# Patient Record
Sex: Male | Born: 1980 | Race: Black or African American | Hispanic: No | Marital: Married | Smoking: Former smoker
Health system: Southern US, Community
[De-identification: ages and names within clinical notes are randomized; demographics above are authoritative.]

## PROBLEM LIST (undated history)

## (undated) ENCOUNTER — Emergency Department (HOSPITAL_COMMUNITY): Payer: Self-pay | Source: Home / Self Care

## (undated) DIAGNOSIS — D573 Sickle-cell trait: Secondary | ICD-10-CM

## (undated) HISTORY — PX: HERNIA REPAIR: SHX51

## (undated) HISTORY — PX: TOOTH EXTRACTION: SUR596

---
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
## 2001-09-20 ENCOUNTER — Emergency Department (HOSPITAL_COMMUNITY): Admission: EM | Admit: 2001-09-20 | Discharge: 2001-09-20 | Payer: Self-pay | Admitting: Emergency Medicine

## 2003-03-05 ENCOUNTER — Emergency Department (HOSPITAL_COMMUNITY): Admission: EM | Admit: 2003-03-05 | Discharge: 2003-03-05 | Payer: Self-pay | Admitting: Emergency Medicine

## 2003-03-07 ENCOUNTER — Emergency Department (HOSPITAL_COMMUNITY): Admission: EM | Admit: 2003-03-07 | Discharge: 2003-03-07 | Payer: Self-pay | Admitting: Emergency Medicine

## 2003-03-09 ENCOUNTER — Emergency Department (HOSPITAL_COMMUNITY): Admission: EM | Admit: 2003-03-09 | Discharge: 2003-03-09 | Payer: Self-pay | Admitting: Emergency Medicine

## 2003-07-19 ENCOUNTER — Emergency Department (HOSPITAL_COMMUNITY): Admission: AD | Admit: 2003-07-19 | Discharge: 2003-07-19 | Payer: Self-pay | Admitting: Family Medicine

## 2003-07-20 ENCOUNTER — Emergency Department (HOSPITAL_COMMUNITY): Admission: AD | Admit: 2003-07-20 | Discharge: 2003-07-20 | Payer: Self-pay | Admitting: Family Medicine

## 2003-08-28 ENCOUNTER — Emergency Department (HOSPITAL_COMMUNITY): Admission: AD | Admit: 2003-08-28 | Discharge: 2003-08-28 | Payer: Self-pay | Admitting: Family Medicine

## 2003-12-30 ENCOUNTER — Emergency Department (HOSPITAL_COMMUNITY): Admission: EM | Admit: 2003-12-30 | Discharge: 2003-12-30 | Payer: Self-pay | Admitting: Emergency Medicine

## 2004-10-08 ENCOUNTER — Emergency Department (HOSPITAL_COMMUNITY): Admission: EM | Admit: 2004-10-08 | Discharge: 2004-10-08 | Payer: Self-pay | Admitting: Family Medicine

## 2004-12-24 ENCOUNTER — Emergency Department (HOSPITAL_COMMUNITY): Admission: EM | Admit: 2004-12-24 | Discharge: 2004-12-24 | Payer: Self-pay | Admitting: Family Medicine

## 2005-01-14 ENCOUNTER — Emergency Department (HOSPITAL_COMMUNITY): Admission: EM | Admit: 2005-01-14 | Discharge: 2005-01-14 | Payer: Self-pay | Admitting: Emergency Medicine

## 2005-02-05 ENCOUNTER — Emergency Department (HOSPITAL_COMMUNITY): Admission: EM | Admit: 2005-02-05 | Discharge: 2005-02-05 | Payer: Self-pay | Admitting: Family Medicine

## 2005-07-25 ENCOUNTER — Emergency Department (HOSPITAL_COMMUNITY): Admission: EM | Admit: 2005-07-25 | Discharge: 2005-07-26 | Payer: Self-pay | Admitting: *Deleted

## 2006-01-27 ENCOUNTER — Emergency Department (HOSPITAL_COMMUNITY): Admission: EM | Admit: 2006-01-27 | Discharge: 2006-01-27 | Payer: Self-pay | Admitting: Family Medicine

## 2006-09-09 ENCOUNTER — Emergency Department (HOSPITAL_COMMUNITY): Admission: EM | Admit: 2006-09-09 | Discharge: 2006-09-09 | Payer: Self-pay | Admitting: Emergency Medicine

## 2007-05-27 ENCOUNTER — Emergency Department: Payer: Self-pay | Admitting: Internal Medicine

## 2007-12-17 ENCOUNTER — Emergency Department: Payer: Self-pay | Admitting: Unknown Physician Specialty

## 2008-04-07 ENCOUNTER — Emergency Department (HOSPITAL_COMMUNITY): Admission: EM | Admit: 2008-04-07 | Discharge: 2008-04-07 | Payer: Self-pay | Admitting: Family Medicine

## 2008-07-20 ENCOUNTER — Ambulatory Visit (HOSPITAL_COMMUNITY): Admission: RE | Admit: 2008-07-20 | Discharge: 2008-07-20 | Payer: Self-pay | Admitting: Family Medicine

## 2008-12-04 ENCOUNTER — Ambulatory Visit (HOSPITAL_COMMUNITY): Admission: RE | Admit: 2008-12-04 | Discharge: 2008-12-04 | Payer: Self-pay | Admitting: Family Medicine

## 2009-02-07 ENCOUNTER — Ambulatory Visit (HOSPITAL_COMMUNITY): Admission: RE | Admit: 2009-02-07 | Discharge: 2009-02-07 | Payer: Self-pay | Admitting: Family Medicine

## 2009-03-01 ENCOUNTER — Encounter: Admission: RE | Admit: 2009-03-01 | Discharge: 2009-03-01 | Payer: Self-pay | Admitting: Family Medicine

## 2009-05-23 ENCOUNTER — Encounter: Admission: RE | Admit: 2009-05-23 | Discharge: 2009-05-23 | Payer: Self-pay | Admitting: Family Medicine

## 2009-12-19 ENCOUNTER — Emergency Department (HOSPITAL_COMMUNITY): Admission: EM | Admit: 2009-12-19 | Discharge: 2009-12-19 | Payer: Self-pay | Admitting: Emergency Medicine

## 2010-01-02 ENCOUNTER — Ambulatory Visit: Payer: Self-pay | Admitting: Internal Medicine

## 2010-01-09 ENCOUNTER — Emergency Department: Payer: Self-pay | Admitting: Emergency Medicine

## 2010-01-10 ENCOUNTER — Emergency Department: Payer: Self-pay | Admitting: Emergency Medicine

## 2010-01-15 ENCOUNTER — Ambulatory Visit: Payer: Self-pay | Admitting: Internal Medicine

## 2010-02-01 ENCOUNTER — Ambulatory Visit: Payer: Self-pay | Admitting: Internal Medicine

## 2010-04-04 ENCOUNTER — Emergency Department (HOSPITAL_COMMUNITY): Admission: EM | Admit: 2010-04-04 | Discharge: 2010-04-04 | Payer: Self-pay | Admitting: Family Medicine

## 2010-11-17 ENCOUNTER — Emergency Department (HOSPITAL_COMMUNITY): Payer: Medicaid Other

## 2010-11-17 ENCOUNTER — Emergency Department (HOSPITAL_COMMUNITY)
Admission: EM | Admit: 2010-11-17 | Discharge: 2010-11-18 | Disposition: A | Discharge status: Home / Self Care | Payer: Medicaid Other | Attending: Emergency Medicine | Admitting: Emergency Medicine

## 2010-11-17 DIAGNOSIS — K5289 Other specified noninfective gastroenteritis and colitis: Secondary | ICD-10-CM | POA: Insufficient documentation

## 2010-11-17 DIAGNOSIS — R112 Nausea with vomiting, unspecified: Secondary | ICD-10-CM | POA: Insufficient documentation

## 2010-11-17 DIAGNOSIS — R197 Diarrhea, unspecified: Secondary | ICD-10-CM | POA: Insufficient documentation

## 2010-11-17 LAB — DIFFERENTIAL
Basophils Absolute: 0 10*3/uL (ref 0.0–0.1)
Eosinophils Absolute: 0 10*3/uL (ref 0.0–0.7)
Eosinophils Relative: 0 % (ref 0–5)
Monocytes Absolute: 0.5 10*3/uL (ref 0.1–1.0)
Neutro Abs: 6.3 10*3/uL (ref 1.7–7.7)
Neutrophils Relative %: 87 % — ABNORMAL HIGH (ref 43–77)

## 2010-11-17 LAB — COMPREHENSIVE METABOLIC PANEL
BUN: 12 mg/dL (ref 6–23)
CO2: 22 mEq/L (ref 19–32)
Calcium: 9.1 mg/dL (ref 8.4–10.5)
Chloride: 101 mEq/L (ref 96–112)
Creatinine, Ser: 1.12 mg/dL (ref 0.4–1.5)
GFR calc non Af Amer: >60 mL/min (ref >60)
Glucose, Bld: 107 mg/dL — ABNORMAL HIGH (ref 70–99)
Potassium: 3.9 mEq/L (ref 3.5–5.1)
Sodium: 131 mEq/L — ABNORMAL LOW (ref 135–145)

## 2010-11-17 LAB — URINALYSIS, ROUTINE W REFLEX MICROSCOPIC
Glucose, UA: NEGATIVE mg/dL
Hgb urine dipstick: NEGATIVE
Urobilinogen, UA: 0.2 mg/dL (ref 0.0–1.0)

## 2010-11-17 LAB — CBC
HCT: 42.1 % (ref 39.0–52.0)
RBC: 5.27 MIL/uL (ref 4.22–5.81)
WBC: 7.3 10*3/uL (ref 4.0–10.5)

## 2010-11-18 MED ORDER — IOHEXOL 300 MG/ML  SOLN
100.0000 mL | Freq: Once | INTRAMUSCULAR | Status: AC | PRN
  Administered 2010-11-18: 100 mL via INTRAVENOUS

## 2011-05-19 ENCOUNTER — Inpatient Hospital Stay (INDEPENDENT_AMBULATORY_CARE_PROVIDER_SITE_OTHER)
Admission: RE | Admit: 2011-05-19 | Discharge: 2011-05-19 | Disposition: A | Discharge status: Home / Self Care | Payer: Medicaid Other | Source: Ambulatory Visit | Attending: Family Medicine | Admitting: Family Medicine

## 2011-05-19 DIAGNOSIS — K047 Periapical abscess without sinus: Secondary | ICD-10-CM

## 2011-07-16 ENCOUNTER — Other Ambulatory Visit: Payer: Self-pay

## 2011-07-16 ENCOUNTER — Encounter: Payer: Self-pay | Admitting: Emergency Medicine

## 2011-07-16 ENCOUNTER — Emergency Department (HOSPITAL_COMMUNITY): Payer: Medicaid Other

## 2011-07-16 ENCOUNTER — Emergency Department (HOSPITAL_COMMUNITY)
Admission: EM | Admit: 2011-07-16 | Discharge: 2011-07-17 | Disposition: A | Discharge status: Home / Self Care | Payer: Medicaid Other | Attending: Emergency Medicine | Admitting: Emergency Medicine

## 2011-07-16 DIAGNOSIS — R5383 Other fatigue: Secondary | ICD-10-CM | POA: Insufficient documentation

## 2011-07-16 DIAGNOSIS — R05 Cough: Secondary | ICD-10-CM | POA: Insufficient documentation

## 2011-07-16 DIAGNOSIS — R197 Diarrhea, unspecified: Secondary | ICD-10-CM | POA: Insufficient documentation

## 2011-07-16 DIAGNOSIS — R070 Pain in throat: Secondary | ICD-10-CM | POA: Insufficient documentation

## 2011-07-16 DIAGNOSIS — R509 Fever, unspecified: Secondary | ICD-10-CM | POA: Insufficient documentation

## 2011-07-16 DIAGNOSIS — J111 Influenza due to unidentified influenza virus with other respiratory manifestations: Secondary | ICD-10-CM | POA: Insufficient documentation

## 2011-07-16 DIAGNOSIS — R0602 Shortness of breath: Secondary | ICD-10-CM | POA: Insufficient documentation

## 2011-07-16 DIAGNOSIS — J3489 Other specified disorders of nose and nasal sinuses: Secondary | ICD-10-CM | POA: Insufficient documentation

## 2011-07-16 DIAGNOSIS — R059 Cough, unspecified: Secondary | ICD-10-CM | POA: Insufficient documentation

## 2011-07-16 DIAGNOSIS — R5381 Other malaise: Secondary | ICD-10-CM | POA: Insufficient documentation

## 2011-07-16 DIAGNOSIS — R079 Chest pain, unspecified: Secondary | ICD-10-CM | POA: Insufficient documentation

## 2011-07-16 DIAGNOSIS — R110 Nausea: Secondary | ICD-10-CM | POA: Insufficient documentation

## 2011-07-16 DIAGNOSIS — IMO0001 Reserved for inherently not codable concepts without codable children: Secondary | ICD-10-CM | POA: Insufficient documentation

## 2011-07-16 DIAGNOSIS — R000 Tachycardia, unspecified: Secondary | ICD-10-CM | POA: Insufficient documentation

## 2011-07-16 HISTORY — DX: Sickle-cell trait: D57.3

## 2011-07-16 LAB — COMPREHENSIVE METABOLIC PANEL
ALT: 41 U/L (ref 0–53)
BUN: 9 mg/dL (ref 6–23)
CO2: 26 mEq/L (ref 19–32)
Calcium: 9.4 mg/dL (ref 8.4–10.5)
Chloride: 100 mEq/L (ref 96–112)
GFR calc Af Amer: >90 mL/min (ref >90)
Glucose, Bld: 137 mg/dL — ABNORMAL HIGH (ref 70–99)
Total Bilirubin: 0.3 mg/dL (ref 0.3–1.2)
Total Protein: 7.2 g/dL (ref 6.0–8.3)

## 2011-07-16 LAB — CBC
HCT: 39.8 % (ref 39.0–52.0)
MCH: 27.8 pg (ref 26.0–34.0)
MCHC: 34.7 g/dL (ref 30.0–36.0)
MCV: 80.1 fL (ref 78.0–100.0)
Platelets: 243 10*3/uL (ref 150–400)
RBC: 4.97 MIL/uL (ref 4.22–5.81)
RDW: 13.9 % (ref 11.5–15.5)

## 2011-07-16 LAB — DIFFERENTIAL
Basophils Relative: 0 % (ref 0–1)
Monocytes Relative: 11 % (ref 3–12)
Neutrophils Relative %: 81 % — ABNORMAL HIGH (ref 43–77)

## 2011-07-16 LAB — RETICULOCYTES: Retic Ct Pct: 1.1 % (ref 0.4–3.1)

## 2011-07-16 MED ORDER — ONDANSETRON HCL 4 MG/2ML IJ SOLN
4.0000 mg | Freq: Once | INTRAMUSCULAR | Status: AC
  Administered 2011-07-16: 4 mg via INTRAVENOUS
  Filled 2011-07-16: qty 2

## 2011-07-16 MED ORDER — SODIUM CHLORIDE 0.9 % IV BOLUS (SEPSIS)
1000.0000 mL | Freq: Once | INTRAVENOUS | Status: AC
  Administered 2011-07-16: 1000 mL via INTRAVENOUS

## 2011-07-16 MED ORDER — KETOROLAC TROMETHAMINE 30 MG/ML IJ SOLN
30.0000 mg | Freq: Once | INTRAMUSCULAR | Status: AC
  Administered 2011-07-16: 30 mg via INTRAVENOUS
  Filled 2011-07-16: qty 1

## 2011-07-16 NOTE — ED Notes (Signed)
Dr Miller at bedside. 

## 2011-07-16 NOTE — ED Notes (Signed)
Patient complaining of body aches and weakness since yesterday. Also complaining of diarrhea. Denies nausea/vomiting.

## 2011-07-16 NOTE — ED Provider Notes (Addendum)
History     CSN: [id number] Arrival date & time: 07/16/2011 10:55 PM   First MD Initiated Contact with Patient 07/16/11 2302      Chief Complaint  Patient presents with  . Generalized Body Aches  . Weakness    (Consider location/radiation/quality/duration/timing/severity/associated sxs/prior treatment) HPI Comments: Pt presents with CC of weakness. Sx started yeserday Gradual in onset Constant Nothing makes better or worse Associated with f/c/n/diarrhea/cough and sore throat.  He has no sick contacts but does have sickle cell trait - has never required transfusion and according to my review of the EMR, he has no admissions to this hospital for SS complications.  Patient is a 30 y.o. male presenting with weakness. The history is provided by the patient and medical records.  Weakness The primary symptoms include nausea. Primary symptoms do not include vomiting.  Additional symptoms include weakness. Additional symptoms do not include neck stiffness.    Past Medical History  Diagnosis Date  . Sickle cell trait     History reviewed. No pertinent past surgical history.  History reviewed. No pertinent family history.  History  Substance Use Topics  . Smoking status: Never Smoker   . Smokeless tobacco: Not on file  . Alcohol Use: No      Review of Systems  Constitutional: Positive for chills and fatigue.  HENT: Positive for congestion and sore throat. Negative for ear pain, neck pain, neck stiffness and ear discharge.   Eyes: Negative for pain, redness and visual disturbance.  Respiratory: Positive for cough and shortness of breath.   Cardiovascular: Positive for chest pain. Negative for palpitations and leg swelling.  Gastrointestinal: Positive for nausea and diarrhea ( loose stools). Negative for vomiting and abdominal pain.  Genitourinary: Negative for dysuria.  Musculoskeletal: Positive for myalgias. Negative for joint swelling.  Skin: Negative for rash.    Neurological: Positive for weakness.  Hematological: Negative for adenopathy.    Allergies  Amoxicillin  Home Medications   Current Outpatient Rx  Name Route Sig Dispense Refill  . NAPROXEN 500 MG PO TABS Oral Take 1 tablet (500 mg total) by mouth 2 (two) times daily with a meal. 30 tablet 0    BP 118/63  Pulse 111  Temp(Src) 100.1 F (37.8 C) (Oral)  Resp 16  Ht 5\' 11"  (1.803 m)  Wt 191 lb (86.637 kg)  BMI 26.64 kg/m2  SpO2 99%  Physical Exam  Nursing note and vitals reviewed. Constitutional: He appears well-developed and well-nourished. No distress.  HENT:  Head: Normocephalic and atraumatic.  Mouth/Throat: Oropharynx is clear and moist. No oropharyngeal exudate.       OP is mildly erythematous without asymetry, exudate or hypertrophy  Eyes: Conjunctivae and EOM are normal. Pupils are equal, round, and reactive to light. Right eye exhibits no discharge. Left eye exhibits no discharge. No scleral icterus.  Neck: Normal range of motion. Neck supple. No JVD present. No thyromegaly present.  Cardiovascular: Regular rhythm, normal heart sounds and intact distal pulses.  Exam reveals no gallop and no friction rub.   No murmur heard.      Tachycardia   Pulmonary/Chest: Effort normal and breath sounds normal. No respiratory distress. He has no wheezes. He has no rales.  Abdominal: Soft. Bowel sounds are normal. He exhibits no distension and no mass. There is no tenderness.  Musculoskeletal: Normal range of motion. He exhibits no edema and no tenderness.  Lymphadenopathy:    He has no cervical adenopathy.  Neurological: He is alert. Coordination normal.  Skin: Skin is warm and dry. No rash noted. No erythema.  Psychiatric: He has a normal mood and affect. His behavior is normal.    ED Course  Procedures (including critical care time)  Labs Reviewed  DIFFERENTIAL - Abnormal; Notable for the following:    Neutrophils Relative 81 (*)    Lymphocytes Relative 8 (*)     Lymphs Abs 0.6 (*)    All other components within normal limits  COMPREHENSIVE METABOLIC PANEL - Abnormal; Notable for the following:    Potassium 3.4 (*)    Glucose, Bld 137 (*)    All other components within normal limits  CBC  RETICULOCYTES  URINALYSIS, ROUTINE W REFLEX MICROSCOPIC   Dg Chest 2 View  07/17/2011  *RADIOLOGY REPORT*  Clinical Data: Cough and congestion  CHEST - 2 VIEW  Comparison: 07/20/2008.  CT scan from 01/14/2005  Findings: The lungs are clear without focal infiltrate, edema, pneumothorax or pleural effusion. The cardiopericardial silhouette is within normal limits for size. Nodular densities seen posterior to the hilar region on the lateral film corresponds to the finding seen on the CT scan from 2006.  This is stable in the interval.  IMPRESSION: Stable exam.  No acute findings.  Original Report Authenticated By: ERIC A. MANSELL, M.D.     1. Influenza       MDM  Tachycardia and fever - c/w Flu like illness - no sounds on lung exam to suggest underlying pna or acute chest and pt is sickle trait, not sickle cell dz.  Has some orthostasis as he sits up with pulse from 105 to 125.  IVF, meds, labs and CXR.  Lab work revealed no acute findings, chest x-ray negative, review of records shows the patient has sickle cell trait, not sickle cell disease. He will be treated with anti-inflammatories, fever control at home, followup as needed. These findings were discussed the patient  Home medications  Naprosyn 500 mg by mouth twice a day      Vida Roller, MD 07/17/11 0120  ED ECG REPORT   Date: 07/17/2011   Rate: 109  Rhythm: sinus tachycardia  QRS Axis: normal  Intervals: normal  ST/T Wave abnormalities: normal  Conduction Disutrbances:none  Narrative Interpretation:   Old EKG Reviewed: No old EKG for comparison   Vida Roller, MD 07/17/11 [phone]

## 2011-07-17 LAB — URINALYSIS, ROUTINE W REFLEX MICROSCOPIC
Hgb urine dipstick: NEGATIVE
Ketones, ur: NEGATIVE mg/dL
Urobilinogen, UA: 0.2 mg/dL (ref 0.0–1.0)
pH: 6 (ref 5.0–8.0)

## 2011-07-17 MED ORDER — MORPHINE SULFATE 4 MG/ML IJ SOLN
4.0000 mg | Freq: Once | INTRAMUSCULAR | Status: AC
  Administered 2011-07-17: 4 mg via INTRAVENOUS
  Filled 2011-07-17: qty 1

## 2011-07-17 MED ORDER — NAPROXEN 500 MG PO TABS: 500.0000 mg | ORAL_TABLET | Freq: Two times a day (BID) | ORAL | Status: DC

## 2011-07-17 MED ORDER — SODIUM CHLORIDE 0.9 % IV BOLUS (SEPSIS)
1000.0000 mL | Freq: Once | INTRAVENOUS | Status: AC
  Administered 2011-07-17: 1000 mL via INTRAVENOUS

## 2011-07-17 NOTE — ED Notes (Signed)
Patient transported to X-ray 

## 2011-07-17 NOTE — ED Notes (Signed)
Pt reporting improvement in pain level following morphine administration.

## 2012-02-18 ENCOUNTER — Other Ambulatory Visit: Payer: Self-pay | Admitting: Family Medicine

## 2012-02-18 DIAGNOSIS — M5126 Other intervertebral disc displacement, lumbar region: Secondary | ICD-10-CM

## 2012-02-23 ENCOUNTER — Inpatient Hospital Stay: Admission: RE | Admit: 2012-02-23 | Payer: Medicaid Other | Source: Ambulatory Visit

## 2012-03-15 ENCOUNTER — Inpatient Hospital Stay: Admission: RE | Admit: 2012-03-15 | Payer: Medicaid Other | Source: Ambulatory Visit

## 2012-03-19 ENCOUNTER — Other Ambulatory Visit: Payer: Medicaid Other

## 2012-05-19 NOTE — Patient Instructions (Addendum)
20 ABDI HUSAK  05/19/2012   Your procedure is scheduled on:  05/24/2012  Report to [facility] at  1050  AM.  Call this number if you have problems the morning of surgery: [phone]   Remember:   Do not eat food:After Midnight.  May have clear liquids:until Midnight   Take these medicines the morning of surgery with A SIP OF WATER:  none   Do not wear jewelry, make-up or nail polish.  Do not wear lotions, powders, or perfumes. You may wear deodorant.  Do not shave 48 hours prior to surgery. Men may shave face and neck.  Do not bring valuables to the hospital.  Contacts, dentures or bridgework may not be worn into surgery.  Leave suitcase in the car. After surgery it may be brought to your room.  For patients admitted to the hospital, checkout time is 11:00 AM the day of discharge.   Patients discharged the day of surgery will not be allowed to drive home.  Name and phone number of your driver: family  Special Instructions: Shower using CHG 2 nights before surgery and the night before surgery.  If you shower the day of surgery use CHG.  Use special wash - you have one bottle of CHG for all showers.  You should use approximately 1/3 of the bottle for each shower.   Please read over the following fact sheets that you were given: Pain Booklet, MRSA Information, Surgical Site Infection Prevention, Anesthesia Post-op Instructions and Care and Recovery After Surgery Hernia A hernia occurs when an internal organ pushes out through a weak spot in the abdominal wall. Hernias most commonly occur in the groin and around the navel. Hernias often can be pushed back into place (reduced). Most hernias tend to get worse over time. Some abdominal hernias can get stuck in the opening (irreducible or incarcerated hernia) and cannot be reduced. An irreducible abdominal hernia which is tightly squeezed into the opening is at risk for impaired blood supply (strangulated hernia). A strangulated hernia is a  medical emergency. Because of the risk for an irreducible or strangulated hernia, surgery may be recommended to repair a hernia. CAUSES   Heavy lifting.  Prolonged coughing.  Straining to have a bowel movement.  A cut (incision) made during an abdominal surgery. HOME CARE INSTRUCTIONS   Bed rest is not required. You may continue your normal activities.  Avoid lifting more than 10 pounds (4.5 kg) or straining.  Cough gently. If you are a smoker it is best to stop. Even the best hernia repair can break down with the continual strain of coughing. Even if you do not have your hernia repaired, a cough will continue to aggravate the problem.  Do not wear anything tight over your hernia. Do not try to keep it in with an outside bandage or truss. These can damage abdominal contents if they are trapped within the hernia sac.  Eat a normal diet.  Avoid constipation. Straining over long periods of time will increase hernia size and encourage breakdown of repairs. If you cannot do this with diet alone, stool softeners may be used. SEEK IMMEDIATE MEDICAL CARE IF:   You have a fever.  You develop increasing abdominal pain.  You feel nauseous or vomit.  Your hernia is stuck outside the abdomen, looks discolored, feels hard, or is tender.  You have any changes in your bowel habits or in the hernia that are unusual for you.  You have increased  pain or swelling around the hernia.  You cannot push the hernia back in place by applying gentle pressure while lying down. MAKE SURE YOU:   Understand these instructions.  Will watch your condition.  Will get help right away if you are not doing well or get worse. Document Released: 07/21/2005 Document Revised: 10/13/2011 Document Reviewed: 03/09/2008 [facility] Patient Information 2013 [place], [place]. PATIENT INSTRUCTIONS POST-ANESTHESIA  IMMEDIATELY FOLLOWING SURGERY:  Do not drive or operate machinery for the first twenty four hours after  surgery.  Do not make any important decisions for twenty four hours after surgery or while taking narcotic pain medications or sedatives.  If you develop intractable nausea and vomiting or a severe headache please notify your doctor immediately.  FOLLOW-UP:  Please make an appointment with your surgeon as instructed. You do not need to follow up with anesthesia unless specifically instructed to do so.  WOUND CARE INSTRUCTIONS (if applicable):  Keep a dry clean dressing on the anesthesia/puncture wound site if there is drainage.  Once the wound has quit draining you may leave it open to air.  Generally you should leave the bandage intact for twenty four hours unless there is drainage.  If the epidural site drains for more than 36-48 hours please call the anesthesia department.  QUESTIONS?:  Please feel free to call your physician or the hospital operator if you have any questions, and they will be happy to assist you.

## 2012-05-20 ENCOUNTER — Inpatient Hospital Stay (HOSPITAL_COMMUNITY): Admission: RE | Admit: 2012-05-20 | Discharge: 2012-05-20 | Payer: Medicaid Other | Source: Ambulatory Visit

## 2012-05-20 ENCOUNTER — Encounter (HOSPITAL_COMMUNITY): Payer: Self-pay | Admitting: Pharmacy Technician

## 2012-05-21 ENCOUNTER — Encounter (HOSPITAL_COMMUNITY)
Admission: RE | Admit: 2012-05-21 | Discharge: 2012-05-21 | Disposition: A | Discharge status: Home / Self Care | Payer: Medicaid Other | Source: Ambulatory Visit | Attending: General Surgery | Admitting: General Surgery

## 2012-05-21 ENCOUNTER — Encounter (HOSPITAL_COMMUNITY): Payer: Self-pay

## 2012-05-21 LAB — CBC WITH DIFFERENTIAL/PLATELET
Basophils Absolute: 0 10*3/uL (ref 0.0–0.1)
HCT: 41.1 % (ref 39.0–52.0)
Lymphocytes Relative: 39 % (ref 12–46)
Monocytes Absolute: 0.6 10*3/uL (ref 0.1–1.0)
Neutro Abs: 2.2 10*3/uL (ref 1.7–7.7)
Neutrophils Relative %: 44 % (ref 43–77)
Platelets: 310 10*3/uL (ref 150–400)
RDW: 13.8 % (ref 11.5–15.5)
WBC: 5.1 10*3/uL (ref 4.0–10.5)

## 2012-05-21 LAB — SURGICAL PCR SCREEN
MRSA, PCR: NEGATIVE
Staphylococcus aureus: NEGATIVE

## 2012-05-21 LAB — BASIC METABOLIC PANEL
Chloride: 99 mEq/L (ref 96–112)
Creatinine, Ser: 1.01 mg/dL (ref 0.50–1.35)
GFR calc Af Amer: >90 mL/min (ref >90)

## 2012-05-21 NOTE — Patient Instructions (Signed)
20 Dennis Travis  05/21/2012   Your procedure is scheduled on:  Monday, 05/24/12  Report to [facility] at 1050 AM.  Call this number if you have problems the morning of surgery: [phone]   Remember:   Do not eat food:After Midnight.  May have clear liquids:until Midnight .  Clear liquids include soda, tea, black coffee, apple or grape juice, broth.  Take these medicines the morning of surgery with A SIP OF WATER: none   Do not wear jewelry, make-up or nail polish.  Do not wear lotions, powders, or perfumes. You may wear deodorant.  Do not shave 48 hours prior to surgery. Men may shave face and neck.  Do not bring valuables to the hospital.  Contacts, dentures or bridgework may not be worn into surgery.  Leave suitcase in the car. After surgery it may be brought to your room.  For patients admitted to the hospital, checkout time is 11:00 AM the day of discharge.   Patients discharged the day of surgery will not be allowed to drive home.  Name and phone number of your driver: family  Special Instructions: Shower using CHG 2 nights before surgery and the night before surgery.  If you shower the day of surgery use CHG.  Use special wash - you have one bottle of CHG for all showers.  You should use approximately 1/3 of the bottle for each shower.   Please read over the following fact sheets that you were given: Pain Booklet, Coughing and Deep Breathing, MRSA Information, Surgical Site Infection Prevention, Anesthesia Post-op Instructions and Care and Recovery After Surgery   Hernia, Surgical Repair Care After Refer to this sheet in the next few weeks. These discharge instructions provide you with general information on caring for yourself after you leave the hospital. Your caregiver may also give you specific instructions. Your treatment has been planned according to the most current medical practices available, but unavoidable complications sometimes occur. If you have any problems or  questions after discharge, please call your caregiver. HOME CARE INSTRUCTIONS   It is normal to be sore for a couple weeks after surgery. See your caregiver if this seems to be getting worse rather than better.  Put ice on the operative site.  Put ice in a plastic bag.  Place a towel between your skin and the bag.  Leave the ice on for 15 to 20 minutes at a time, 3 to 4 times a day for the first 2 days.  Change bandages (dressings) as directed.  Keep the wound dry and clean. The wound may be washed gently with soap and water. Gently blot or dab the wound dry. Do not take baths, use swimming pools, or use hot tubs for 10 days, or as directed by your caregiver.  Only take over-the-counter or prescription medicines for pain, discomfort, or fever as directed by your caregiver.  Continue your normal diet as directed.  Do not drive until your caregiver says it is okay.  Do not lift anything more than 10 pounds or play contact sports for 3 weeks, or as directed.  Make an appointment to see your caregiver for stitches (sutures) or staple removal when instructed. SEEK MEDICAL CARE IF:   You have increased bleeding coming from the wounds.  You have blood in your stool.  You see redness, swelling, or have increasing pain in the wounds.  You have fluid (pus) coming from the wound.  You have an oral temperature above 102 F (38.9  C).  You notice a bad smell coming from the wound or dressing.  You develop lightheadedness or feel faint. SEEK IMMEDIATE MEDICAL CARE IF:   You develop a rash.  You have difficulty breathing.  You develop any reaction or side effects to medicines given. MAKE SURE YOU:   Understand these instructions.  Will watch your condition.  Will get help right away if you are not doing well or get worse. Document Released: 02/07/2005 Document Revised: 10/13/2011 Document Reviewed: 06/20/2009 [facility] Patient Information 2013 [place], [place]. Hernia,  Surgical Repair A hernia occurs when an internal organ pushes out through a weak spot in the belly (abdominal) wall muscles. Hernias commonly occur in the groin and around the navel. Hernias often can be pushed back into place (reduced). Most hernias tend to get worse over time. Problems occur when abdominal contents get stuck in the opening (incarcerated hernia). The blood supply gets cut off (strangulated hernia). This is an emergency and needs surgery. Otherwise, hernia repair can be an elective procedure. This means you can schedule this at your convenience when an emergency is not present. Because complications can occur, if you decide to repair the hernia, it is best to do it soon. When it becomes an emergency procedure, there is increased risk of complications after surgery. CAUSES   Heavy lifting.  Obesity.  Prolonged coughing.  Straining to move your bowels.  Hernias can also occur through a cut (incision) by a surgeonafter an abdominal operation. HOME CARE INSTRUCTIONS Before the repair:  Bed rest is not required. You may continue your normal activities, but avoid heavy lifting (more than 10 pounds) or straining. Cough gently. If you are a smoker, it is best to stop. Even the best hernia repair can break down with the continual strain of coughing.  Do not wear anything tight over your hernia. Do not try to keep it in with an outside bandage or truss. These can damage abdominal contents if they are trapped in the hernia sac.  Eat a normal diet. Avoid constipation. Straining over long periods of time to have a bowel movement will increase hernia size. It also can breakdown repairs. If you cannot do this with diet alone, laxatives or stool softeners may be used. PRIOR TO SURGERY, SEEK IMMEDIATE MEDICAL CARE IF: You have problems (symptoms) of a trapped (incarcerated) hernia. Symptoms include:  An oral temperature above 102 F (38.9 C) develops, or as your caregiver  suggests.  Increasing abdominal pain.  Feeling sick to your stomach(nausea) and vomiting.  You stop passing gas or stool.  The hernia is stuck outside the abdomen, looks discolored, feels hard, or is tender.  You have any changes in your bowel habits or in the hernia that is unusual for you. LET YOUR CAREGIVERS KNOW ABOUT THE FOLLOWING:  Allergies.  Medications taken including herbs, eye drops, over the counter medications, and creams.  Use of steroids (by mouth or creams).  Family or personal history of problems with anesthetics or Novocaine.  Possibility of pregnancy, if this applies.  Personal history of blood clots (thrombophlebitis).  Family or personal history of bleeding or blood problems.  Previous surgery.  Other health problems. BEFORE THE PROCEDURE You should be present 1 hour prior to your procedure, or as directed by your caregiver.  AFTER THE PROCEDURE After surgery, you will be taken to the recovery area. A nurse will watch and check your progress there. Once you are awake, stable, and taking fluids well, you will be allowed to go  home as long as there are no problems. Once home, an ice pack (wrapped in a light towel) applied to your operative site may help with discomfort. It may also keep the swelling down. Do not lift anything heavier than 10 pounds (4.55 kilograms). Take showers not baths. Do not drive while taking narcotics. Follow instructions as suggested by your caregiver.  SEEK IMMEDIATE MEDICAL CARE IF: After surgery:  There is redness, swelling, or increasing pain in the wound.  There is pus coming from the wound.  There is drainage from a wound lasting longer than 1 day.  An unexplained oral temperature above 102 F (38.9 C) develops.  You notice a foul smell coming from the wound or dressing.  There is a breaking open of a wound (edged not staying together) after the sutures have been removed.  You notice increasing pain in the shoulders  (shoulder strap areas).  You develop dizzy episodes or fainting while standing.  You develop persistent nausea or vomiting.  You develop a rash.  You have difficulty breathing.  You develop any reaction or side effects to medications given. MAKE SURE YOU:   Understand these instructions.  Will watch your condition.  Will get help right away if you are not doing well or get worse. Document Released: 01/14/2001 Document Revised: 10/13/2011 Document Reviewed: 12/07/2007 [facility] Patient Information 2013 [place], [place].

## 2012-05-24 ENCOUNTER — Encounter (HOSPITAL_COMMUNITY): Payer: Self-pay | Admitting: Anesthesiology

## 2012-05-24 ENCOUNTER — Encounter (HOSPITAL_COMMUNITY)
Admission: RE | Disposition: A | Discharge status: Home / Self Care | Payer: Self-pay | Source: Ambulatory Visit | Attending: General Surgery

## 2012-05-24 ENCOUNTER — Encounter (HOSPITAL_COMMUNITY): Payer: Self-pay | Admitting: *Deleted

## 2012-05-24 ENCOUNTER — Ambulatory Visit (HOSPITAL_COMMUNITY): Payer: Medicaid Other | Admitting: Anesthesiology

## 2012-05-24 ENCOUNTER — Ambulatory Visit (HOSPITAL_COMMUNITY)
Admission: RE | Admit: 2012-05-24 | Discharge: 2012-05-24 | Disposition: A | Discharge status: Home / Self Care | Payer: Medicaid Other | Source: Ambulatory Visit | Attending: General Surgery | Admitting: General Surgery

## 2012-05-24 DIAGNOSIS — K429 Umbilical hernia without obstruction or gangrene: Secondary | ICD-10-CM | POA: Insufficient documentation

## 2012-05-24 HISTORY — PX: UMBILICAL HERNIA REPAIR: SHX196

## 2012-05-24 SURGERY — REPAIR, HERNIA, UMBILICAL, ADULT
Anesthesia: General | Site: Abdomen | Wound class: Clean

## 2012-05-24 MED ORDER — CIPROFLOXACIN IN D5W 400 MG/200ML IV SOLN
INTRAVENOUS | Status: AC
  Filled 2012-05-24: qty 200

## 2012-05-24 MED ORDER — PROPOFOL 10 MG/ML IV EMUL
INTRAVENOUS | Status: AC
  Filled 2012-05-24: qty 20

## 2012-05-24 MED ORDER — FENTANYL CITRATE 0.05 MG/ML IJ SOLN
INTRAMUSCULAR | Status: AC
  Filled 2012-05-24: qty 2

## 2012-05-24 MED ORDER — DOCUSATE SODIUM 100 MG PO CAPS: 100.0000 mg | ORAL_CAPSULE | Freq: Two times a day (BID) | ORAL | Status: DC

## 2012-05-24 MED ORDER — CIPROFLOXACIN IN D5W 400 MG/200ML IV SOLN
INTRAVENOUS | Status: DC | PRN
  Administered 2012-05-24: 400 mg via INTRAVENOUS

## 2012-05-24 MED ORDER — LIDOCAINE HCL 1 % IJ SOLN
INTRAMUSCULAR | Status: DC | PRN
  Administered 2012-05-24: 50 mg via INTRADERMAL

## 2012-05-24 MED ORDER — ONDANSETRON HCL 4 MG/2ML IJ SOLN: 4.0000 mg | Freq: Once | INTRAMUSCULAR | Status: DC | PRN

## 2012-05-24 MED ORDER — MIDAZOLAM HCL 2 MG/2ML IJ SOLN
INTRAMUSCULAR | Status: AC
  Filled 2012-05-24: qty 2

## 2012-05-24 MED ORDER — CIPROFLOXACIN IN D5W 400 MG/200ML IV SOLN: 400.0000 mg | INTRAVENOUS | Status: DC

## 2012-05-24 MED ORDER — MIDAZOLAM HCL 5 MG/5ML IJ SOLN
INTRAMUSCULAR | Status: DC | PRN
  Administered 2012-05-24: 2 mg via INTRAVENOUS

## 2012-05-24 MED ORDER — FENTANYL CITRATE 0.05 MG/ML IJ SOLN
INTRAMUSCULAR | Status: DC | PRN
  Administered 2012-05-24 (×2): 50 ug via INTRAVENOUS

## 2012-05-24 MED ORDER — LACTATED RINGERS IV SOLN
INTRAVENOUS | Status: DC
  Administered 2012-05-24: 1000 mL via INTRAVENOUS

## 2012-05-24 MED ORDER — POVIDONE-IODINE 10 % EX OINT
TOPICAL_OINTMENT | CUTANEOUS | Status: AC
  Filled 2012-05-24: qty 1

## 2012-05-24 MED ORDER — MIDAZOLAM HCL 2 MG/2ML IJ SOLN
1.0000 mg | INTRAMUSCULAR | Status: DC | PRN
  Administered 2012-05-24: 2 mg via INTRAVENOUS

## 2012-05-24 MED ORDER — OXYCODONE-ACETAMINOPHEN 5-325 MG PO TABS: 1.0000 | ORAL_TABLET | ORAL | Status: DC | PRN

## 2012-05-24 MED ORDER — BACITRACIN-NEOMYCIN-POLYMYXIN 400-5-5000 EX OINT
TOPICAL_OINTMENT | CUTANEOUS | Status: AC
  Filled 2012-05-24: qty 1

## 2012-05-24 MED ORDER — BUPIVACAINE HCL (PF) 0.5 % IJ SOLN
INTRAMUSCULAR | Status: DC | PRN
  Administered 2012-05-24: 10 mL

## 2012-05-24 MED ORDER — FENTANYL CITRATE 0.05 MG/ML IJ SOLN
25.0000 ug | INTRAMUSCULAR | Status: DC | PRN
Start: 2012-05-24 — End: 2012-05-24
  Administered 2012-05-24 (×4): 50 ug via INTRAVENOUS

## 2012-05-24 MED ORDER — PROPOFOL 10 MG/ML IV BOLUS
INTRAVENOUS | Status: DC | PRN
  Administered 2012-05-24: 160 mg via INTRAVENOUS

## 2012-05-24 MED ORDER — 0.9 % SODIUM CHLORIDE (POUR BTL) OPTIME
TOPICAL | Status: DC | PRN
  Administered 2012-05-24: 1000 mL

## 2012-05-24 MED ORDER — BUPIVACAINE HCL (PF) 0.5 % IJ SOLN
INTRAMUSCULAR | Status: AC
  Filled 2012-05-24: qty 30

## 2012-05-24 MED ORDER — LIDOCAINE HCL (PF) 1 % IJ SOLN
INTRAMUSCULAR | Status: AC
  Filled 2012-05-24: qty 5

## 2012-05-24 MED ORDER — BACITRACIN-NEOMYCIN-POLYMYXIN 400-5-5000 EX OINT
TOPICAL_OINTMENT | CUTANEOUS | Status: DC | PRN
  Administered 2012-05-24: 1 via TOPICAL

## 2012-05-24 SURGICAL SUPPLY — 51 items
ATTRACTOMAT 16X20 MAGNETIC DRP (DRAPES) ×2 IMPLANT
BAG HAMPER (MISCELLANEOUS) ×2 IMPLANT
CLEANER TIP ELECTROSURG 2X2 (MISCELLANEOUS) ×2 IMPLANT
CLOTH BEACON ORANGE TIMEOUT ST (SAFETY) ×2 IMPLANT
COVER LIGHT HANDLE STERIS (MISCELLANEOUS) ×4 IMPLANT
DECANTER SPIKE VIAL GLASS SM (MISCELLANEOUS) ×2 IMPLANT
ELECT REM PT RETURN 9FT ADLT (ELECTROSURGICAL) ×2
ELECTRODE REM PT RTRN 9FT ADLT (ELECTROSURGICAL) ×1 IMPLANT
FORMALIN 10 PREFIL 120ML (MISCELLANEOUS) ×2 IMPLANT
GLOVE BIOGEL PI IND STRL 7.0 (GLOVE) IMPLANT
GLOVE BIOGEL PI INDICATOR 7.0 (GLOVE) ×2
GLOVE EXAM NITRILE MD LF STRL (GLOVE) ×1 IMPLANT
GLOVE SKINSENSE NS SZ7.0 (GLOVE) ×1
GLOVE SKINSENSE STRL SZ7.0 (GLOVE) ×1 IMPLANT
GLOVE SS BIOGEL STRL SZ 6.5 (GLOVE) IMPLANT
GLOVE SUPERSENSE BIOGEL SZ 6.5 (GLOVE) ×2
GOWN STRL REIN XL XLG (GOWN DISPOSABLE) ×5 IMPLANT
INST SET MINOR GENERAL (KITS) ×2 IMPLANT
KIT ROOM TURNOVER APOR (KITS) ×2 IMPLANT
MANIFOLD NEPTUNE II (INSTRUMENTS) ×2 IMPLANT
NDL HYPO 25X1 1.5 SAFETY (NEEDLE) ×1 IMPLANT
NEEDLE HYPO 25X1 1.5 SAFETY (NEEDLE) ×2 IMPLANT
NS IRRIG 1000ML POUR BTL (IV SOLUTION) ×2 IMPLANT
PACK MINOR (CUSTOM PROCEDURE TRAY) ×2 IMPLANT
PAD ARMBOARD 7.5X6 YLW CONV (MISCELLANEOUS) ×2 IMPLANT
SET BASIN LINEN APH (SET/KITS/TRAYS/PACK) ×2 IMPLANT
SOL PREP PROV IODINE SCRUB 4OZ (MISCELLANEOUS) ×2 IMPLANT
SPONGE GAUZE 2X2 8PLY STRL LF (GAUZE/BANDAGES/DRESSINGS) ×1 IMPLANT
SPONGE GAUZE 4X4 12PLY (GAUZE/BANDAGES/DRESSINGS) ×2 IMPLANT
SPONGE INTESTINAL PEANUT (DISPOSABLE) ×2 IMPLANT
SPONGE LAP 18X18 X RAY DECT (DISPOSABLE) ×2 IMPLANT
STAPLER VISISTAT 35W (STAPLE) ×2 IMPLANT
SUT PROLENE 0 CT 1 CR/8 (SUTURE) IMPLANT
SUT PROLENE 1 CT 1 30 (SUTURE) IMPLANT
SUT PROLENE 4 0 PS 2 18 (SUTURE) IMPLANT
SUT PROLENE MO 6 BLUE #0 30IN (SUTURE) IMPLANT
SUT SILK 2 0 (SUTURE) ×2
SUT SILK 2-0 18XBRD TIE 12 (SUTURE) ×1 IMPLANT
SUT VIC AB 3-0 SH 27 (SUTURE) ×2
SUT VIC AB 3-0 SH 27X BRD (SUTURE) ×1 IMPLANT
SUT VIC AB 4-0 PS2 27 (SUTURE) ×1 IMPLANT
SUT VIC AB 5-0 P-3 18X BRD (SUTURE) IMPLANT
SUT VIC AB 5-0 P3 18 (SUTURE)
SUT VICRYL 0 UR6 27IN ABS (SUTURE) ×1 IMPLANT
SUT VICRYL AB 3 0 TIES (SUTURE) ×1 IMPLANT
SUT VICRYL AB 3-0 BRD CT 36IN (SUTURE) ×3 IMPLANT
SYR BULB IRRIGATION 50ML (SYRINGE) ×2 IMPLANT
SYR CONTROL 10ML LL (SYRINGE) ×2 IMPLANT
TAPE CLOTH SURG 4X10 WHT LF (GAUZE/BANDAGES/DRESSINGS) ×1 IMPLANT
TRAY FOLEY CATH 14FR (SET/KITS/TRAYS/PACK) IMPLANT
WATER STERILE IRR 1000ML POUR (IV SOLUTION) ×4 IMPLANT

## 2012-05-24 NOTE — Brief Op Note (Signed)
05/24/2012  12:11 PM  PATIENT:  Dennis Travis  31 y.o. male  PRE-OPERATIVE DIAGNOSIS:  umbilical hernia  POST-OPERATIVE DIAGNOSIS:  umbilical hernia  PROCEDURE:  Procedure(s) (LRB) with comments: HERNIA REPAIR UMBILICAL ADULT (N/A)  SURGEON:  Surgeon(s) and Role:    * Marlane Hatcher, MD - Primary  PHYSICIAN ASSISTANT:   ASSISTANTS: none   ANESTHESIA:   general  EBL:  Total I/O In: 600 [I.V.:600] Out: -   BLOOD ADMINISTERED:none  DRAINS: none   LOCAL MEDICATIONS USED:  MARCAINE 0.5% without epi ~ 8cc    SPECIMEN:  Source of Specimen:  encarcerated omental fat from umbilical hernia.  DISPOSITION OF SPECIMEN:  PATHOLOGY  COUNTS:  YES  TOURNIQUET:  * No tourniquets in log *  DICTATION: .Other Dictation: Dictation Number or DICT. # [id number].  PLAN OF CARE: Discharge to home after PACU  PATIENT DISPOSITION:  PACU - hemodynamically stable.   Delay start of Pharmacological VTE agent (>24hrs) due to surgical blood loss or risk of bleeding: not applicable

## 2012-05-24 NOTE — Anesthesia Postprocedure Evaluation (Signed)
  Anesthesia Post-op Note  Patient: Dennis Travis  Procedure(s) Performed: Procedure(s) (LRB) with comments: HERNIA REPAIR UMBILICAL ADULT (N/A)  Patient Location: PACU  Anesthesia Type: General  Level of Consciousness: awake, alert , oriented and patient cooperative  Airway and Oxygen Therapy: Patient Spontanous Breathing  Post-op Pain: 3 /10, mild  Post-op Assessment: Post-op Vital signs reviewed, Patient's Cardiovascular Status Stable, Respiratory Function Stable, Patent Airway, No signs of Nausea or vomiting and Pain level controlled  Post-op Vital Signs: Reviewed and stable  Complications: No apparent anesthesia complications

## 2012-05-24 NOTE — Transfer of Care (Signed)
Immediate Anesthesia Transfer of Care Note  Patient: Dennis Travis  Procedure(s) Performed: Procedure(s) (LRB) with comments: HERNIA REPAIR UMBILICAL ADULT (N/A)  Patient Location: PACU  Anesthesia Type: General  Level of Consciousness: awake and patient cooperative  Airway & Oxygen Therapy: Patient Spontanous Breathing and Patient connected to face mask oxygen  Post-op Assessment: Report given to PACU RN, Post -op Vital signs reviewed and stable and Patient moving all extremities  Post vital signs: Reviewed and stable  Complications: No apparent anesthesia complications

## 2012-05-24 NOTE — Op Note (Signed)
Dennis Travis, BUCHBERGER NO.:  [id number]  MEDICAL RECORD NO.:  [id number]  LOCATION:  [facility]                          FACILITY:  [facility]  PHYSICIAN:  Barbaraann Barthel, M.D. DATE OF BIRTH:  [birth year]  DATE OF PROCEDURE:  05/24/2012 DATE OF DISCHARGE:                              OPERATIVE REPORT   SURGEON:  Barbaraann Barthel, MD  PREOPERATIVE DIAGNOSIS:  Umbilical hernia.  POSTOPERATIVE DIAGNOSIS:  Umbilical hernia.  WOUND CLASSIFICATION:  Clean.  SPECIMEN:  Incarcerated umbilical hernia, incarcerated omentum of umbilical hernia.  PROCEDURE:  Umbilical herniorrhaphy without mesh.  NOTE:  This a 31 year old black male who had a small umbilical hernia with a palpable mass within it, thought to be omental fat.  This was causing no GI disturbance and this was reducible in the office.  He was scheduled for an outpatient umbilical herniorrhaphy.  We discussed repair, complications not limited to, but including bleeding, infection, and recurrence and informed consent was obtained.  We also discussed the possibility of the use of mesh prosthesis.  However, this defect appeared to be very small to me and a mesh prosthesis seemed to be unnecessary.  Informed consent was obtained.  GROSS OPERATIVE FINDINGS:  Incarcerated omental fat within the defect that was approximately the size of a dime.  TECHNIQUE:  The patient was placed in supine position and after the adequate administration of LMA anesthesia, his entire abdomen was prepped with Betadine solution and draped in usual manner.  A periumbilical incision was carried out over the superior aspect of the umbilicus down to the fascia where the defect was clearly visible.  We dissected the hernia sac with its incarcerated fat free and sent this as a specimen after we ligated this with 2-0 silk.  We then repaired the fascial defect using a single suture of 0 Vicryl.  I did not feel that any mesh was necessary due to the  size of this defect.  After checking for hemostasis, I then used approximately 8 mL 0.5% Sensorcaine around the incision for postoperative comfort, irrigated with normal saline solution and then closed the subcutaneous layer with 4-0 Vicryl and the skin was approximated with stapling device. Prior to closure, all sponge, needle, and instrument counts found to be correct.  Estimated blood loss was minimal, less than 10 mL.  The patient received about 600 mL of crystalloids intraoperatively.  No drains were placed and there were no complications.     Barbaraann Barthel, M.D.     WB/MEDQ  D:  05/24/2012  T:  05/24/2012  Job:  [id number]

## 2012-05-24 NOTE — Progress Notes (Signed)
31 yr old B male for elective repair of umbilical hernia.  Procedure and risks addressed and informed consent obtained.  Labs reviewed and all questions answered.  Filed Vitals:   05/24/12 1050  BP: 114/79  Pulse:   Temp:   Resp: 16  pulse 65 temp. 97.5  No clinical change in H&P; dict. # [id number].

## 2012-05-24 NOTE — Anesthesia Preprocedure Evaluation (Signed)
Anesthesia Evaluation  Patient identified by MRN, date of birth, ID band Patient awake    Reviewed: Allergy & Precautions, H&P , NPO status , Patient's Chart, lab work & pertinent test results  Airway Mallampati: I TM Distance: >3 FB     Dental  (+) Teeth Intact   Pulmonary Recent URI , Resolved,  breath sounds clear to auscultation        Cardiovascular negative cardio ROS  Rhythm:Regular Rate:Normal     Neuro/Psych    GI/Hepatic   Endo/Other    Renal/GU      Musculoskeletal   Abdominal   Peds  Hematology  (+) Blood dyscrasia, Sickle cell trait ,   Anesthesia Other Findings   Reproductive/Obstetrics                           Anesthesia Physical Anesthesia Plan  ASA: I  Anesthesia Plan: General   Post-op Pain Management:    Induction: Intravenous  Airway Management Planned: LMA  Additional Equipment:   Intra-op Plan:   Post-operative Plan: Extubation in OR  Informed Consent: I have reviewed the patients History and Physical, chart, labs and discussed the procedure including the risks, benefits and alternatives for the proposed anesthesia with the patient or authorized representative who has indicated his/her understanding and acceptance.     Plan Discussed with:   Anesthesia Plan Comments:         Anesthesia Quick Evaluation

## 2012-05-24 NOTE — Anesthesia Procedure Notes (Signed)
Procedure Name: LMA Insertion Date/Time: 05/24/2012 11:30 AM Performed by: Despina Hidden Pre-anesthesia Checklist: Emergency Drugs available, Suction available, Patient identified and Patient being monitored Patient Re-evaluated:Patient Re-evaluated prior to inductionOxygen Delivery Method: Circle system utilized Preoxygenation: Pre-oxygenation with 100% oxygen Intubation Type: IV induction Ventilation: Mask ventilation without difficulty LMA Size: 4.0 Tube type: Oral Number of attempts: 1 Placement Confirmation: ETT inserted through vocal cords under direct vision,  positive ETCO2 and breath sounds checked- equal and bilateral Tube secured with: Tape Dental Injury: Teeth and Oropharynx as per pre-operative assessment

## 2012-05-24 NOTE — Progress Notes (Signed)
Postt OP check.  Pt awake and alert dressings are dry and in tact.  Doing well immediately post op.  Discharge and follow up arranged.  Filed Vitals:   05/24/12 1215  BP: 116/82  Pulse:   Temp: 97.7 F (36.5 C)  Resp: 16  pulse 62/min.

## 2012-05-25 ENCOUNTER — Emergency Department (HOSPITAL_COMMUNITY)
Admission: EM | Admit: 2012-05-25 | Discharge: 2012-05-25 | Disposition: A | Discharge status: Home / Self Care | Payer: Medicaid Other | Attending: Emergency Medicine | Admitting: Emergency Medicine

## 2012-05-25 ENCOUNTER — Encounter (HOSPITAL_COMMUNITY): Payer: Self-pay

## 2012-05-25 DIAGNOSIS — R079 Chest pain, unspecified: Secondary | ICD-10-CM | POA: Insufficient documentation

## 2012-05-25 DIAGNOSIS — Z9889 Other specified postprocedural states: Secondary | ICD-10-CM | POA: Insufficient documentation

## 2012-05-25 DIAGNOSIS — D573 Sickle-cell trait: Secondary | ICD-10-CM | POA: Insufficient documentation

## 2012-05-25 DIAGNOSIS — Z87891 Personal history of nicotine dependence: Secondary | ICD-10-CM | POA: Insufficient documentation

## 2012-05-25 DIAGNOSIS — R109 Unspecified abdominal pain: Secondary | ICD-10-CM | POA: Insufficient documentation

## 2012-05-25 DIAGNOSIS — Z79899 Other long term (current) drug therapy: Secondary | ICD-10-CM | POA: Insufficient documentation

## 2012-05-25 NOTE — ED Notes (Signed)
Pt reports had hernia repair yesterday.  Reports has appt with Dr. Malvin Johns today at 2pm but says pain was too bad to wait.  Says feels like is full of gas and hasn't been able to have a bm.   Denies n/v.

## 2012-05-25 NOTE — ED Provider Notes (Signed)
History     CSN: [id number]  Arrival date & time 05/25/12  1315   First MD Initiated Contact with Patient 05/25/12 1339      Chief Complaint  Patient presents with  . Abdominal Pain    (Consider location/radiation/quality/duration/timing/severity/associated sxs/prior treatment) The history is provided by the patient.  patient is a 31 year old male status post go-cart repair yesterday by Dr. Malvin Johns was discharged home from the recovery room in the evening. Patient states he feels like his full gas and hasn't been able to have a bowel movement. He has some abdominal discomfort occasionally radiates up into the chest only when he lays down. He was sent home with Percocet and Colace. Patient was concerned that maybe he was having a heart attack patient's symptoms to the chest or intermittent as stated only present when he lays down. He has no cardiac risk factors no history of hypertension no history of premature cardiovascular disease in his immediate family. Most of the symptoms are in his abdominal area greatest discomfort around the incision site no fevers. No nausea no vomiting no diarrhea.  The abdominal pain at times is 8/10 nonradiating except up into the chest when he lays down. Pain is described as a dull ache  Past Medical History  Diagnosis Date  . Sickle cell trait     Past Surgical History  Procedure Date  . Hernia repair   . Tooth extraction     No family history on file.  History  Substance Use Topics  . Smoking status: Former Smoker -- 1.0 packs/day for 7 years    Types: Cigarettes    Quit date: 05/21/1986  . Smokeless tobacco: Not on file  . Alcohol Use: No      Review of Systems  Constitutional: Negative for fever.  HENT: Negative for neck pain.   Eyes: Negative for redness.  Respiratory: Negative for cough and shortness of breath.   Cardiovascular: Positive for chest pain. Negative for palpitations.  Gastrointestinal: Positive for abdominal pain.  Negative for nausea, vomiting and diarrhea.  Genitourinary: Negative for dysuria.  Musculoskeletal: Negative for back pain.  Skin: Negative for rash.  Neurological: Negative for headaches.  Hematological: Does not bruise/bleed easily.    Allergies  Amoxicillin  Home Medications   Current Outpatient Rx  Name Route Sig Dispense Refill  . DOCUSATE SODIUM 100 MG PO CAPS Oral Take 1 capsule (100 mg total) by mouth 2 (two) times daily. 10 capsule 0  . OXYCODONE-ACETAMINOPHEN 5-325 MG PO TABS Oral Take 1 tablet by mouth every 4 (four) hours as needed for pain. 30 tablet 0    BP 116/83  Pulse 72  Temp 98.4 F (36.9 C) (Oral)  Resp 20  Ht 5\' 11"  (1.803 m)  Wt 192 lb (87.091 kg)  BMI 26.78 kg/m2  SpO2 97%  Physical Exam  Nursing note and vitals reviewed. Constitutional: He is oriented to person, place, and time. He appears well-developed and well-nourished. No distress.  HENT:  Head: Normocephalic and atraumatic.  Mouth/Throat: Oropharynx is clear and moist.  Eyes: Conjunctivae normal and EOM are normal. Pupils are equal, round, and reactive to light.  Neck: Normal range of motion. Neck supple.  Cardiovascular: Normal rate, regular rhythm and normal heart sounds.   No murmur heard. Pulmonary/Chest: Effort normal and breath sounds normal. No respiratory distress. He has no wheezes. He has no rales.  Abdominal: Soft. Bowel sounds are normal.       Periumbilical Donald incision dressing is clean and dry  no blood staining mild discomfort around the area no evidence of erythema dressing was not removed. Patient will be seeing his surgeon.  Musculoskeletal: Normal range of motion. He exhibits no edema.  Neurological: He is alert and oriented to person, place, and time. No cranial nerve deficit. He exhibits normal muscle tone. Coordination normal.  Skin: Skin is warm. He is not diaphoretic. No erythema.    ED Course  Procedures (including critical care time)  Labs Reviewed - No data  to display No results found.    Date: 05/25/2012  Rate: 74  Rhythm: normal sinus rhythm  QRS Axis: normal  Intervals: normal  ST/T Wave abnormalities: normal  Conduction Disutrbances:none  Narrative Interpretation:   Old EKG Reviewed: unchanged From 07/16/11  1. Abdominal pain       MDM   Patient status post elective umbilical hernia repair yesterday by Dr. Malvin Johns patient was discharged from the recovery room home in the evening patient has had development of abdominal pain and feeling like she's bloated in gas build up some of the pain will radiate up into his chest only when he lays down has no pain in the chest otherwise. Patient's symptoms are not consistent with acute cardiac event EKG is totally normal. Lungs are clear bilaterally oxygen saturation is 97%. The coronary repair a bandage dressing is still in place discussed with Dr. Malvin Johns Dr. Zella Ball will see him over and is off this at 3:00 mostly symptoms seem to be post surgical type complaints.        Shelda Jakes, MD 05/25/12 [phone]

## 2012-05-28 ENCOUNTER — Encounter (HOSPITAL_COMMUNITY): Payer: Self-pay | Admitting: General Surgery

## 2013-04-06 ENCOUNTER — Emergency Department: Payer: Self-pay | Admitting: Emergency Medicine

## 2013-04-06 LAB — CBC WITH DIFFERENTIAL/PLATELET
Basophil #: 0 10*3/uL (ref 0.0–0.1)
Eosinophil %: 1.7 %
HCT: 42.3 % (ref 40.0–52.0)
HGB: 14.6 g/dL (ref 13.0–18.0)
Lymphocyte #: 2.5 10*3/uL (ref 1.0–3.6)
Lymphocyte %: 42.8 %
MCH: 27.7 pg (ref 26.0–34.0)
Monocyte #: 0.8 x10 3/mm (ref 0.2–1.0)
Monocyte %: 13.5 %
Neutrophil #: 2.4 10*3/uL (ref 1.4–6.5)
Neutrophil %: 41.2 %
RDW: 14.4 % (ref 11.5–14.5)
WBC: 5.9 10*3/uL (ref 3.8–10.6)

## 2013-04-06 LAB — URINALYSIS, COMPLETE
Bacteria: NONE SEEN
Bilirubin,UR: NEGATIVE
Glucose,UR: NEGATIVE mg/dL (ref 0–75)
Ketone: NEGATIVE
Leukocyte Esterase: NEGATIVE
Ph: 6 (ref 4.5–8.0)
Protein: NEGATIVE
RBC,UR: 1 /HPF (ref 0–5)
Specific Gravity: 1.013 (ref 1.003–1.030)
Squamous Epithelial: NONE SEEN
WBC UR: <1 /HPF (ref 0–5)

## 2013-04-06 LAB — COMPREHENSIVE METABOLIC PANEL
Albumin: 4.1 g/dL (ref 3.4–5.0)
Alkaline Phosphatase: 64 U/L (ref 50–136)
Bilirubin,Total: 0.5 mg/dL (ref 0.2–1.0)
Creatinine: 0.99 mg/dL (ref 0.60–1.30)
Glucose: 112 mg/dL — ABNORMAL HIGH (ref 65–99)
Osmolality: 274 (ref 275–301)
SGPT (ALT): 66 U/L (ref 12–78)
Total Protein: 7.6 g/dL (ref 6.4–8.2)

## 2013-04-28 ENCOUNTER — Other Ambulatory Visit (HOSPITAL_COMMUNITY): Payer: Self-pay | Admitting: Family Medicine

## 2013-04-28 DIAGNOSIS — M545 Low back pain: Secondary | ICD-10-CM

## 2013-04-28 DIAGNOSIS — IMO0002 Reserved for concepts with insufficient information to code with codable children: Secondary | ICD-10-CM

## 2013-05-03 ENCOUNTER — Ambulatory Visit (HOSPITAL_COMMUNITY): Admission: RE | Admit: 2013-05-03 | Payer: Medicaid Other | Source: Ambulatory Visit

## 2013-06-15 ENCOUNTER — Encounter (HOSPITAL_COMMUNITY): Payer: Self-pay | Admitting: Emergency Medicine

## 2013-06-15 ENCOUNTER — Emergency Department (HOSPITAL_COMMUNITY)
Admission: EM | Admit: 2013-06-15 | Discharge: 2013-06-15 | Disposition: A | Discharge status: Home / Self Care | Payer: Self-pay | Attending: Emergency Medicine | Admitting: Emergency Medicine

## 2013-06-15 ENCOUNTER — Emergency Department (HOSPITAL_COMMUNITY): Payer: Self-pay

## 2013-06-15 DIAGNOSIS — B349 Viral infection, unspecified: Secondary | ICD-10-CM

## 2013-06-15 DIAGNOSIS — D5700 Hb-SS disease with crisis, unspecified: Secondary | ICD-10-CM | POA: Insufficient documentation

## 2013-06-15 DIAGNOSIS — R059 Cough, unspecified: Secondary | ICD-10-CM | POA: Insufficient documentation

## 2013-06-15 DIAGNOSIS — Z87891 Personal history of nicotine dependence: Secondary | ICD-10-CM | POA: Insufficient documentation

## 2013-06-15 DIAGNOSIS — R197 Diarrhea, unspecified: Secondary | ICD-10-CM | POA: Insufficient documentation

## 2013-06-15 DIAGNOSIS — Z880 Allergy status to penicillin: Secondary | ICD-10-CM | POA: Insufficient documentation

## 2013-06-15 DIAGNOSIS — R5381 Other malaise: Secondary | ICD-10-CM | POA: Insufficient documentation

## 2013-06-15 DIAGNOSIS — Z791 Long term (current) use of non-steroidal anti-inflammatories (NSAID): Secondary | ICD-10-CM | POA: Insufficient documentation

## 2013-06-15 DIAGNOSIS — R52 Pain, unspecified: Secondary | ICD-10-CM | POA: Insufficient documentation

## 2013-06-15 DIAGNOSIS — B9789 Other viral agents as the cause of diseases classified elsewhere: Secondary | ICD-10-CM | POA: Insufficient documentation

## 2013-06-15 DIAGNOSIS — R51 Headache: Secondary | ICD-10-CM | POA: Insufficient documentation

## 2013-06-15 DIAGNOSIS — R112 Nausea with vomiting, unspecified: Secondary | ICD-10-CM | POA: Insufficient documentation

## 2013-06-15 DIAGNOSIS — R05 Cough: Secondary | ICD-10-CM | POA: Insufficient documentation

## 2013-06-15 LAB — COMPREHENSIVE METABOLIC PANEL
Albumin: 4.2 g/dL (ref 3.5–5.2)
BUN: 6 mg/dL (ref 6–23)
Calcium: 9.5 mg/dL (ref 8.4–10.5)
Creatinine, Ser: 0.99 mg/dL (ref 0.50–1.35)
Potassium: 3.9 mEq/L (ref 3.5–5.1)
Total Protein: 7.6 g/dL (ref 6.0–8.3)

## 2013-06-15 LAB — CBC WITH DIFFERENTIAL/PLATELET
Basophils Relative: 1 % (ref 0–1)
Eosinophils Absolute: 0.1 10*3/uL (ref 0.0–0.7)
Hemoglobin: 14.6 g/dL (ref 13.0–17.0)
MCH: 28.6 pg (ref 26.0–34.0)
MCHC: 36.3 g/dL — ABNORMAL HIGH (ref 30.0–36.0)
Monocytes Absolute: 0.9 10*3/uL (ref 0.1–1.0)
Monocytes Relative: 15 % — ABNORMAL HIGH (ref 3–12)
Neutrophils Relative %: 65 % (ref 43–77)

## 2013-06-15 LAB — RETICULOCYTES: RBC.: 5.1 MIL/uL (ref 4.22–5.81)

## 2013-06-15 MED ORDER — KETOROLAC TROMETHAMINE 30 MG/ML IJ SOLN
30.0000 mg | Freq: Once | INTRAMUSCULAR | Status: AC
  Administered 2013-06-15: 30 mg via INTRAVENOUS
  Filled 2013-06-15: qty 1

## 2013-06-15 MED ORDER — MORPHINE SULFATE 4 MG/ML IJ SOLN
4.0000 mg | Freq: Once | INTRAMUSCULAR | Status: AC
  Administered 2013-06-15: 4 mg via INTRAVENOUS
  Filled 2013-06-15: qty 1

## 2013-06-15 MED ORDER — IBUPROFEN 800 MG PO TABS: 800.0000 mg | ORAL_TABLET | Freq: Three times a day (TID) | ORAL | Status: DC

## 2013-06-15 MED ORDER — HYDROMORPHONE HCL PF 1 MG/ML IJ SOLN
0.5000 mg | Freq: Once | INTRAMUSCULAR | Status: AC
  Administered 2013-06-15: 0.5 mg via INTRAVENOUS
  Filled 2013-06-15: qty 1

## 2013-06-15 MED ORDER — ONDANSETRON HCL 4 MG/2ML IJ SOLN
4.0000 mg | Freq: Once | INTRAMUSCULAR | Status: AC
  Administered 2013-06-15: 4 mg via INTRAVENOUS
  Filled 2013-06-15: qty 2

## 2013-06-15 MED ORDER — SODIUM CHLORIDE 0.9 % IV BOLUS (SEPSIS)
1000.0000 mL | Freq: Once | INTRAVENOUS | Status: AC
  Administered 2013-06-15: 1000 mL via INTRAVENOUS

## 2013-06-15 NOTE — ED Provider Notes (Signed)
CSN: [id number]     Arrival date & time 06/15/13  4098 History   First MD Initiated Contact with Patient 06/15/13 [phone]     Chief Complaint  Patient presents with  . Sickle Cell Pain Crisis  . Cough  . Generalized Body Aches  . Fever   (Consider location/radiation/quality/duration/timing/severity/associated sxs/prior Treatment) HPI  This is a 85 are old male with a history of sickle cell trait who presents with fever, cough, body aches. Patient reports onset of symptoms yesterday. He reports fevers to 101. He had 2 episodes of nonbilious, nonbloody emesis last night and has had nonbloody diarrhea as well. Patient states that he feels like he is having a sickle crisis which she does not have very often. He reports sharp pain all over which is consistent with his prior crises. He states that he usually does in the crisis when he "gets sick." Patient reports a productive cough. He denies any chest pain, shortness of breath, or abdominal pain. He currently rates his pain a 10 out of 10. He did receive a flu shot this year.  Past Medical History  Diagnosis Date  . Sickle cell trait    Past Surgical History  Procedure Laterality Date  . Hernia repair    . Tooth extraction    . Umbilical hernia repair  05/24/2012    Procedure: HERNIA REPAIR UMBILICAL ADULT;  Surgeon: Marlane Hatcher, MD;  Location: [facility];  Service: General;  Laterality: N/A;   No family history on file. History  Substance Use Topics  . Smoking status: Former Smoker -- 1.00 packs/day for 7 years    Types: Cigarettes    Quit date: 05/21/1986  . Smokeless tobacco: Not on file  . Alcohol Use: No    Review of Systems  Constitutional: Positive for fever, chills and fatigue.  Respiratory: Positive for cough. Negative for chest tightness and shortness of breath.   Cardiovascular: Negative.  Negative for chest pain.  Gastrointestinal: Positive for nausea, vomiting and diarrhea. Negative for abdominal pain.   Genitourinary: Negative.  Negative for dysuria.  Musculoskeletal: Positive for myalgias. Negative for back pain.  Skin: Negative for rash.  Neurological: Positive for headaches.  All other systems reviewed and are negative.    Allergies  Amoxicillin  Home Medications   Current Outpatient Rx  Name  Route  Sig  Dispense  Refill  . diphenhydrAMINE (BENADRYL) 25 mg capsule   Oral   Take 25 mg by mouth every 6 (six) hours as needed.         Marland Kitchen ibuprofen (ADVIL,MOTRIN) 800 MG tablet   Oral   Take 1 tablet (800 mg total) by mouth 3 (three) times daily.   21 tablet   0    BP 102/49  Pulse 84  Temp(Src) 98.7 F (37.1 C) (Oral)  Resp 18  SpO2 100% Physical Exam  Nursing note and vitals reviewed. Constitutional: He is oriented to person, place, and time. He appears well-developed.  Ill appearing but no acute distress, nontoxic  HENT:  Head: Normocephalic and atraumatic.  MM dry  Eyes: Pupils are equal, round, and reactive to light.  Neck: Neck supple.  Cardiovascular: Normal rate, regular rhythm and normal heart sounds.   No murmur heard. Pulmonary/Chest: Effort normal and breath sounds normal. No respiratory distress. He has no wheezes.  coughing  Abdominal: Soft. Bowel sounds are normal. There is no tenderness. There is no rebound.  Musculoskeletal: He exhibits no edema.  Lymphadenopathy:    He has no  cervical adenopathy.  Neurological: He is alert and oriented to person, place, and time.  Skin: Skin is warm and dry.  Psychiatric: He has a normal mood and affect.    ED Course  Procedures (including critical care time) Labs Review Labs Reviewed  CBC WITH DIFFERENTIAL - Abnormal; Notable for the following:    MCHC 36.3 (*)    Monocytes Relative 15 (*)    All other components within normal limits  RETICULOCYTES  COMPREHENSIVE METABOLIC PANEL   Imaging Review Dg Chest 2 View  06/15/2013   CLINICAL DATA:  Sickle cell pain and crisis.  EXAM: CHEST  2 VIEW   COMPARISON:  Chest radiograph July 17, 2011 and CT of the chest January 14, 2005  FINDINGS: Cardiac silhouette is unremarkable and unchanged. Mild fullness of pulmonary hila are slightly decreased without pleural effusions. Nodular densities projecting over the anterior aspect of the thoracic spine again noted. No pneumothorax. Soft tissue planes and included osseous structures are nonsuspicious.  IMPRESSION: No acute cardiopulmonary process.  Similar nodular densities projecting in the posterior lungs from CT of chest January 14, 2005.   Electronically Signed   By: Awilda Metro   On: 06/15/2013 06:46    EKG Interpretation   None       MDM   1. Viral syndrome    Patient presents with symptoms most consistent with a viral syndrome. He is nontoxic-appearing his vital signs are reassuring. Basic labwork was obtained. Patient's hematocrit is 40 and stable. BMP is within normal limits. Chest x-ray shows no evidence of pneumonia. Patient was given pain medication and fluids with some improvement of his symptoms. I advised the patient that his pain and symptoms are most likely secondary to viral illness. He was able to tolerate oral intake prior to discharge. He is to followup with his primary physician if his symptoms worsen. Patient was given strict return precautions including worsening fever, fever refractory to NSAIDs, shortness of breath, chest pain, or any new or concerning symptoms. Patient stated understanding.  After history, exam, and medical workup I feel the patient has been appropriately medically screened and is safe for discharge home. Pertinent diagnoses were discussed with the patient. Patient was given return precautions.     Shon Baton, MD 06/15/13 [phone]

## 2013-06-15 NOTE — ED Notes (Addendum)
C/o sickle cell pain crisis, reports sx of fever, generalized body aches, pain in face, head, ribs ("all over"), also reports productive cough, cough onset Monday, pain onset 0800 tuesday, fever onset 1900 Tuesday, sx similar to usual pain crisis, last crisis last December, took benadryl at 2230. Alert, NAD, calm, interactive, frequent cough in triage. Vomited x2 last night. Denies nausea at this time or diarrhea.

## 2013-06-15 NOTE — Discharge Instructions (Signed)

## 2013-06-19 ENCOUNTER — Encounter (HOSPITAL_COMMUNITY): Payer: Self-pay | Admitting: Emergency Medicine

## 2013-06-19 ENCOUNTER — Emergency Department (HOSPITAL_COMMUNITY)
Admission: EM | Admit: 2013-06-19 | Discharge: 2013-06-19 | Disposition: A | Discharge status: Home / Self Care | Payer: Medicaid Other | Attending: Emergency Medicine | Admitting: Emergency Medicine

## 2013-06-19 ENCOUNTER — Emergency Department (HOSPITAL_COMMUNITY): Payer: Medicaid Other

## 2013-06-19 DIAGNOSIS — H9209 Otalgia, unspecified ear: Secondary | ICD-10-CM | POA: Insufficient documentation

## 2013-06-19 DIAGNOSIS — Z862 Personal history of diseases of the blood and blood-forming organs and certain disorders involving the immune mechanism: Secondary | ICD-10-CM | POA: Insufficient documentation

## 2013-06-19 DIAGNOSIS — H5789 Other specified disorders of eye and adnexa: Secondary | ICD-10-CM | POA: Insufficient documentation

## 2013-06-19 DIAGNOSIS — Z791 Long term (current) use of non-steroidal anti-inflammatories (NSAID): Secondary | ICD-10-CM | POA: Insufficient documentation

## 2013-06-19 DIAGNOSIS — J209 Acute bronchitis, unspecified: Secondary | ICD-10-CM

## 2013-06-19 DIAGNOSIS — H579 Unspecified disorder of eye and adnexa: Secondary | ICD-10-CM | POA: Insufficient documentation

## 2013-06-19 DIAGNOSIS — J069 Acute upper respiratory infection, unspecified: Secondary | ICD-10-CM | POA: Insufficient documentation

## 2013-06-19 DIAGNOSIS — Z87891 Personal history of nicotine dependence: Secondary | ICD-10-CM | POA: Insufficient documentation

## 2013-06-19 MED ORDER — PREDNISONE 50 MG PO TABS
60.0000 mg | ORAL_TABLET | Freq: Once | ORAL | Status: AC
  Administered 2013-06-19: 60 mg via ORAL
  Filled 2013-06-19 (×2): qty 1

## 2013-06-19 MED ORDER — ALBUTEROL SULFATE (5 MG/ML) 0.5% IN NEBU
5.0000 mg | INHALATION_SOLUTION | Freq: Once | RESPIRATORY_TRACT | Status: AC
  Administered 2013-06-19: 5 mg via RESPIRATORY_TRACT
  Filled 2013-06-19: qty 1

## 2013-06-19 MED ORDER — GUAIFENESIN-CODEINE 100-10 MG/5ML PO SOLN
5.0000 mL | Freq: Once | ORAL | Status: AC
  Administered 2013-06-19: 5 mL via ORAL
  Filled 2013-06-19: qty 5

## 2013-06-19 MED ORDER — IPRATROPIUM BROMIDE 0.02 % IN SOLN
0.5000 mg | Freq: Once | RESPIRATORY_TRACT | Status: AC
  Administered 2013-06-19: 0.5 mg via RESPIRATORY_TRACT
  Filled 2013-06-19: qty 2.5

## 2013-06-19 MED ORDER — ALBUTEROL SULFATE HFA 108 (90 BASE) MCG/ACT IN AERS
2.0000 | INHALATION_SPRAY | RESPIRATORY_TRACT | Status: DC
  Administered 2013-06-19: 2 via RESPIRATORY_TRACT
  Filled 2013-06-19: qty 6.7

## 2013-06-19 MED ORDER — PHENYLEPH-PROMETHAZINE-COD 5-6.25-10 MG/5ML PO SYRP: 5.0000 mL | ORAL_SOLUTION | ORAL | Status: DC | PRN

## 2013-06-19 MED ORDER — AZITHROMYCIN 250 MG PO TABS: ORAL_TABLET | ORAL | Status: DC

## 2013-06-19 MED ORDER — PREDNISONE (PAK) 10 MG PO TABS: ORAL_TABLET | ORAL | Status: DC

## 2013-06-19 NOTE — ED Provider Notes (Signed)
CSN: [id number]     Arrival date & time 06/19/13  1352 History   First MD Initiated Contact with Patient 06/19/13 1405     Chief Complaint  Patient presents with  . URI   (Consider location/radiation/quality/duration/timing/severity/associated sxs/prior Treatment) Patient is a 32 y.o. male presenting with URI. The history is provided by the patient.  URI Presenting symptoms: congestion, cough, ear pain, fever and rhinorrhea   Severity:  Moderate Onset quality:  Gradual Duration:  5 days Progression:  Worsening Chronicity:  New Relieved by:  Nothing Worsened by:  Nothing tried Ineffective treatments:  OTC medications Associated symptoms: headaches, myalgias, sinus pain, sneezing and wheezing    Dennis Travis is a 32 y.o. male who presents to the ED with cough, cold, congestin and ear pain that started 5 days ago and has gotten progressively worse.  Past Medical History  Diagnosis Date  . Sickle cell trait    Past Surgical History  Procedure Laterality Date  . Hernia repair    . Tooth extraction    . Umbilical hernia repair  05/24/2012    Procedure: HERNIA REPAIR UMBILICAL ADULT;  Surgeon: Marlane Hatcher, MD;  Location: [facility];  Service: General;  Laterality: N/A;   No family history on file. History  Substance Use Topics  . Smoking status: Former Smoker -- 1.00 packs/day for 7 years    Types: Cigarettes    Quit date: 05/21/1986  . Smokeless tobacco: Not on file  . Alcohol Use: No    Review of Systems  Constitutional: Positive for fever.  HENT: Positive for congestion, ear pain, rhinorrhea and sneezing.   Eyes: Positive for redness and itching. Negative for photophobia and visual disturbance.  Respiratory: Positive for cough and wheezing.   Gastrointestinal: Negative for nausea and vomiting (with cough).  Genitourinary: Negative for dysuria, urgency and frequency.  Musculoskeletal: Positive for myalgias. Negative for back pain.  Skin: Negative for rash.   Neurological: Positive for headaches.  Psychiatric/Behavioral: The patient is not nervous/anxious.     Allergies  Amoxicillin  Home Medications   Current Outpatient Rx  Name  Route  Sig  Dispense  Refill  . diphenhydrAMINE (BENADRYL) 25 mg capsule   Oral   Take 25 mg by mouth every 6 (six) hours as needed.         Marland Kitchen ibuprofen (ADVIL,MOTRIN) 800 MG tablet   Oral   Take 1 tablet (800 mg total) by mouth 3 (three) times daily.   21 tablet   0    BP 115/77  Pulse 79  Temp(Src) 98.7 F (37.1 C) (Oral)  Resp 18  Ht 5\' 11"  (1.803 m)  Wt 190 lb (86.183 kg)  BMI 26.51 kg/m2  SpO2 99% Physical Exam  Nursing note and vitals reviewed. Constitutional: He is oriented to person, place, and time. He appears well-developed and well-nourished. No distress.  HENT:  Head: Normocephalic and atraumatic.  Right Ear: Tympanic membrane normal.  Left Ear: No drainage. Tympanic membrane is erythematous and retracted.  Nose: Rhinorrhea present.  Mouth/Throat: Uvula is midline and mucous membranes are normal. Posterior oropharyngeal erythema present.  Eyes: Conjunctivae and EOM are normal. Pupils are equal, round, and reactive to light.  Neck: Normal range of motion. Neck supple.  Cardiovascular: Normal rate and regular rhythm.   Pulmonary/Chest: Effort normal. He has wheezes.  Abdominal: Soft. There is no tenderness.  Musculoskeletal: Normal range of motion.  Lymphadenopathy:    He has no cervical adenopathy.  Neurological: He is alert and  oriented to person, place, and time. No cranial nerve deficit.  Skin: Skin is warm and dry.  Psychiatric: He has a normal mood and affect. His behavior is normal.    ED Course  Procedures EKG Interpretation   None     Dg Chest 2 View  06/19/2013   CLINICAL DATA:  Cough, congestion for 5 days.  EXAM: CHEST  2 VIEW  COMPARISON:  06/15/2013.  FINDINGS: The heart size and mediastinal contours are within normal limits. There is no focal  consolidation, pleural effusion or pneumothorax. Again noted are lobular opacities in the left upper lobe. 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 The visualized skeletal structures are unremarkable.  IMPRESSION: No active cardiopulmonary disease.   Electronically Signed   By: Elige Ko   On: 06/19/2013 14:23    Re examined After albuterol/atrovent neb treatment patient feeling better, less cough and better air movement. MDM  32 y.o. with otitis media left and bronchitis. Will treat with antibiotics, prednisone, cough medication and albuterol inhaler.  Discussed with the patient and all questioned fully answered. He will return if any problems arise.    Medication List    TAKE these medications       azithromycin 250 MG tablet  Commonly known as:  ZITHROMAX Z-PAK  Take 2 tablets now and then one tablet PO daily until finished for infection     Phenyleph-Promethazine-Cod 5-6.25-10 MG/5ML Syrp  Take 5 mLs by mouth every 4 (four) hours as needed.     predniSONE 10 MG tablet  Commonly known as:  STERAPRED UNI-PAK  Starting 06/20/2013 take 5 tablets by mouth first day then 4, 3, 2, 1      ASK your doctor about these medications       dextromethorphan 30 MG/5ML liquid  Commonly known as:  DELSYM  Take 30 mg by mouth as needed for cough.           [facility] Orlene Och, NP 06/19/13 [phone]

## 2013-06-19 NOTE — ED Notes (Signed)
Pt c/o productive cough with yellowish colored sputum, pain in chest with coughing, and fever since Tuesday.

## 2013-06-20 NOTE — ED Provider Notes (Signed)
Medical screening examination/treatment/procedure(s) were performed by non-physician practitioner and as supervising physician I was immediately available for consultation/collaboration.  EKG Interpretation   None         Joya Gaskins, MD 06/20/13 [phone]

## 2014-08-13 ENCOUNTER — Encounter (HOSPITAL_COMMUNITY): Payer: Self-pay | Admitting: Emergency Medicine

## 2014-08-13 ENCOUNTER — Emergency Department (HOSPITAL_COMMUNITY): Payer: Self-pay

## 2014-08-13 ENCOUNTER — Emergency Department (HOSPITAL_COMMUNITY)
Admission: EM | Admit: 2014-08-13 | Discharge: 2014-08-13 | Disposition: A | Discharge status: Home / Self Care | Payer: Self-pay | Attending: Emergency Medicine | Admitting: Emergency Medicine

## 2014-08-13 DIAGNOSIS — J111 Influenza due to unidentified influenza virus with other respiratory manifestations: Secondary | ICD-10-CM | POA: Insufficient documentation

## 2014-08-13 DIAGNOSIS — Z880 Allergy status to penicillin: Secondary | ICD-10-CM | POA: Insufficient documentation

## 2014-08-13 DIAGNOSIS — Z792 Long term (current) use of antibiotics: Secondary | ICD-10-CM | POA: Insufficient documentation

## 2014-08-13 DIAGNOSIS — R0602 Shortness of breath: Secondary | ICD-10-CM | POA: Insufficient documentation

## 2014-08-13 DIAGNOSIS — Z862 Personal history of diseases of the blood and blood-forming organs and certain disorders involving the immune mechanism: Secondary | ICD-10-CM | POA: Insufficient documentation

## 2014-08-13 DIAGNOSIS — Z7952 Long term (current) use of systemic steroids: Secondary | ICD-10-CM | POA: Insufficient documentation

## 2014-08-13 DIAGNOSIS — Z87891 Personal history of nicotine dependence: Secondary | ICD-10-CM | POA: Insufficient documentation

## 2014-08-13 MED ORDER — IBUPROFEN 800 MG PO TABS: 800.0000 mg | ORAL_TABLET | Freq: Three times a day (TID) | ORAL | Status: DC | PRN

## 2014-08-13 MED ORDER — OSELTAMIVIR PHOSPHATE 75 MG PO CAPS: 75.0000 mg | ORAL_CAPSULE | Freq: Two times a day (BID) | ORAL | Status: DC

## 2014-08-13 NOTE — Discharge Instructions (Signed)
Drink plenty of fluids.  Rest at home 2 days and follow up with your md if not improving.

## 2014-08-13 NOTE — ED Notes (Signed)
Patient reports waking with chills, vomiting, nonproductive cough, and diarrhea. Patient denies any nausea or vomiting. Per patient chest hurts with cough. Patient's wife recently seen by PCP and diagnosed with flu. Patient afebrile in triage.

## 2014-08-13 NOTE — ED Notes (Signed)
Patient with no complaints at this time. Respirations even and unlabored. Skin warm/dry. Discharge instructions reviewed with patient at this time. Patient given opportunity to voice concerns/ask questions. Patient discharged at this time and left Emergency Department with steady gait.   

## 2014-08-13 NOTE — ED Provider Notes (Signed)
CSN: [id number]     Arrival date & time 08/13/14  1327 History   First MD Initiated Contact with Patient 08/13/14 1557     Chief Complaint  Patient presents with  . Generalized Body Aches     (Consider location/radiation/quality/duration/timing/severity/associated sxs/prior Treatment) Patient is a 34 y.o. male presenting with cough. The history is provided by the patient (the pt complains of a cough and body aches).  Cough Cough characteristics:  Non-productive Severity:  Moderate Onset quality:  Gradual Timing:  Constant Progression:  Waxing and waning Chronicity:  New Associated symptoms: fever   Associated symptoms: no chest pain, no eye discharge, no headaches and no rash     Past Medical History  Diagnosis Date  . Sickle cell trait    Past Surgical History  Procedure Laterality Date  . Hernia repair    . Tooth extraction    . Umbilical hernia repair  05/24/2012    Procedure: HERNIA REPAIR UMBILICAL ADULT;  Surgeon: Marlane HatcherWilliam S Bradford, MD;  Location: [facility];  Service: General;  Laterality: N/A;   Family History  Problem Relation Age of Onset  . Diabetes Mother   . Stroke Father    History  Substance Use Topics  . Smoking status: Former Smoker -- 1.00 packs/day for 7 years    Types: Cigarettes    Quit date: 05/21/1986  . Smokeless tobacco: Never Used  . Alcohol Use: No    Review of Systems  Constitutional: Positive for fever. Negative for appetite change and fatigue.  HENT: Negative for congestion, ear discharge and sinus pressure.   Eyes: Negative for discharge.  Respiratory: Positive for cough.   Cardiovascular: Negative for chest pain.  Gastrointestinal: Negative for abdominal pain and diarrhea.  Genitourinary: Negative for frequency and hematuria.  Musculoskeletal: Negative for back pain.  Skin: Negative for rash.  Neurological: Negative for seizures and headaches.  Psychiatric/Behavioral: Negative for hallucinations.      Allergies   Amoxicillin  Home Medications   Prior to Admission medications   Medication Sig Start Date End Date Taking? Authorizing Provider  DM-Doxylamine-Acetaminophen (VICKS NYQUIL COLD & FLU) 15-6.25-325 MG/15ML LIQD Take 30 mLs by mouth daily as needed (for flu symptoms).   Yes Historical Provider, MD  azithromycin (ZITHROMAX Z-PAK) 250 MG tablet Take 2 tablets now and then one tablet PO daily until finished for infection Patient not taking: Reported on 08/13/2014 06/19/13   Janne NapoleonHope M Neese, NP  ibuprofen (ADVIL,MOTRIN) 800 MG tablet Take 1 tablet (800 mg total) by mouth every 8 (eight) hours as needed for moderate pain. 08/13/14   Benny LennertJoseph L Renu Asby, MD  oseltamivir (TAMIFLU) 75 MG capsule Take 1 capsule (75 mg total) by mouth every 12 (twelve) hours. 08/13/14   Benny LennertJoseph L Lyza Houseworth, MD  Phenyleph-Promethazine-Cod 5-6.25-10 MG/5ML SYRP Take 5 mLs by mouth every 4 (four) hours as needed. Patient not taking: Reported on 08/13/2014 06/19/13   Janne NapoleonHope M Neese, NP  predniSONE (STERAPRED UNI-PAK) 10 MG tablet Starting 06/20/2013 take 5 tablets by mouth first day then 4, 3, 2, 1 Patient not taking: Reported on 08/13/2014 06/19/13   Janne NapoleonHope M Neese, NP   BP 121/79 mmHg  Pulse 82  Temp(Src) 98.4 F (36.9 C) (Oral)  Resp 18  Ht 5\' 11"  (1.803 m)  Wt 208 lb (94.348 kg)  BMI 29.02 kg/m2  SpO2 99% Physical Exam  Constitutional: He is oriented to person, place, and time. He appears well-developed.  HENT:  Head: Normocephalic.  Eyes: Conjunctivae and EOM are normal. No scleral  icterus.  Neck: Neck supple. No thyromegaly present.  Cardiovascular: Normal rate and regular rhythm.  Exam reveals no gallop and no friction rub.   No murmur heard. Pulmonary/Chest: No stridor. He has no wheezes. He has no rales. He exhibits no tenderness.  Abdominal: He exhibits no distension. There is no tenderness. There is no rebound.  Musculoskeletal: Normal range of motion. He exhibits no edema.  Lymphadenopathy:    He has no cervical  adenopathy.  Neurological: He is oriented to person, place, and time. He exhibits normal muscle tone. Coordination normal.  Skin: No rash noted. No erythema.  Psychiatric: He has a normal mood and affect. His behavior is normal.    ED Course  Procedures (including critical care time) Labs Review Labs Reviewed - No data to display  Imaging Review Dg Chest 2 View  08/13/2014   CLINICAL DATA:  Nonproductive cough.  Congestion.  Body aches.  EXAM: CHEST  2 VIEW  COMPARISON:  06/19/2013  FINDINGS: Low lung volumes are present, causing crowding of the pulmonary vasculature. Abnormal left hilar prominence. Looking back of the prior chest CT of 01/14/2005, I believe that this is due to a left lower lobe bronchocele projecting over the left hilum. Lack of progression over 10 years argues against malignancy.  Lungs appear otherwise clear.  IMPRESSION: 1. Left lower lobe chronic perihilar bronchocele. 2. Low lung volumes are present, causing crowding of the pulmonary vasculature.   Electronically Signed   By: Herbie Baltimore M.D.   On: 08/13/2014 17:09     EKG Interpretation None      MDM   Final diagnoses:  SOB (shortness of breath)  Influenza    Influenza,  tx with tamiflu and vicodin    Benny Lennert, MD 08/13/14 1750

## 2014-09-11 ENCOUNTER — Encounter (HOSPITAL_COMMUNITY): Payer: Self-pay | Admitting: Emergency Medicine

## 2014-09-11 ENCOUNTER — Emergency Department (HOSPITAL_COMMUNITY)
Admission: EM | Admit: 2014-09-11 | Discharge: 2014-09-11 | Disposition: A | Discharge status: Home / Self Care | Payer: Self-pay | Attending: Emergency Medicine | Admitting: Emergency Medicine

## 2014-09-11 ENCOUNTER — Emergency Department (HOSPITAL_COMMUNITY): Payer: Self-pay

## 2014-09-11 DIAGNOSIS — J018 Other acute sinusitis: Secondary | ICD-10-CM

## 2014-09-11 DIAGNOSIS — Z880 Allergy status to penicillin: Secondary | ICD-10-CM | POA: Insufficient documentation

## 2014-09-11 DIAGNOSIS — J069 Acute upper respiratory infection, unspecified: Secondary | ICD-10-CM

## 2014-09-11 DIAGNOSIS — Z7952 Long term (current) use of systemic steroids: Secondary | ICD-10-CM | POA: Insufficient documentation

## 2014-09-11 DIAGNOSIS — Z862 Personal history of diseases of the blood and blood-forming organs and certain disorders involving the immune mechanism: Secondary | ICD-10-CM | POA: Insufficient documentation

## 2014-09-11 MED ORDER — PSEUDOEPHEDRINE HCL 60 MG PO TABS
60.0000 mg | ORAL_TABLET | Freq: Once | ORAL | Status: AC
  Administered 2014-09-11: 60 mg via ORAL
  Filled 2014-09-11: qty 1

## 2014-09-11 MED ORDER — IBUPROFEN 800 MG PO TABS
800.0000 mg | ORAL_TABLET | Freq: Once | ORAL | Status: AC
  Administered 2014-09-11: 800 mg via ORAL
  Filled 2014-09-11: qty 1

## 2014-09-11 MED ORDER — HYDROCODONE-HOMATROPINE 5-1.5 MG/5ML PO SYRP: 5.0000 mL | ORAL_SOLUTION | Freq: Four times a day (QID) | ORAL | Status: DC | PRN

## 2014-09-11 MED ORDER — PREDNISONE 10 MG PO TABS: ORAL_TABLET | ORAL | Status: DC

## 2014-09-11 MED ORDER — PREDNISONE 50 MG PO TABS
60.0000 mg | ORAL_TABLET | Freq: Once | ORAL | Status: AC
  Administered 2014-09-11: 60 mg via ORAL
  Filled 2014-09-11 (×2): qty 1

## 2014-09-11 MED ORDER — LORATADINE-PSEUDOEPHEDRINE ER 5-120 MG PO TB12: 1.0000 | ORAL_TABLET | Freq: Two times a day (BID) | ORAL | Status: DC

## 2014-09-11 NOTE — ED Notes (Signed)
Pt c/o continued cough since being seen in ED x 3 weeks ago. Pt states cough is productive with yellow/green sputum and worsening since Friday.

## 2014-09-11 NOTE — ED Provider Notes (Signed)
CSN: [id number]     Arrival date & time 09/11/14  0804 History   First MD Initiated Contact with Patient 09/11/14 0818     Chief Complaint  Patient presents with  . Cough     (Consider location/radiation/quality/duration/timing/severity/associated sxs/prior Treatment) Patient is a 34 y.o. male presenting with cough. The history is provided by the patient.  Cough Cough characteristics:  Productive Sputum characteristics:  Green and yellow Severity:  Moderate Onset quality:  Gradual Duration:  3 weeks Timing:  Intermittent Progression:  Worsening Chronicity:  New Smoker: no   Context: upper respiratory infection and weather changes   Relieved by:  Nothing Ineffective treatments: Tamiflu and vicodin. Associated symptoms: chest pain and shortness of breath   Associated symptoms: no eye discharge and no wheezing     Past Medical History  Diagnosis Date  . Sickle cell trait    Past Surgical History  Procedure Laterality Date  . Hernia repair    . Tooth extraction    . Umbilical hernia repair  05/24/2012    Procedure: HERNIA REPAIR UMBILICAL ADULT;  Surgeon: Marlane Hatcher, MD;  Location: [facility];  Service: General;  Laterality: N/A;   Family History  Problem Relation Age of Onset  . Diabetes Mother   . Stroke Father    History  Substance Use Topics  . Smoking status: Former Smoker -- 1.00 packs/day for 7 years    Types: Cigarettes    Quit date: 05/21/1986  . Smokeless tobacco: Never Used  . Alcohol Use: No    Review of Systems  Constitutional: Negative for activity change.       All ROS Neg except as noted in HPI  HENT: Positive for congestion.   Eyes: Negative for photophobia and discharge.  Respiratory: Positive for cough and shortness of breath. Negative for wheezing.   Cardiovascular: Positive for chest pain. Negative for palpitations.  Gastrointestinal: Negative for abdominal pain and blood in stool.  Genitourinary: Negative for dysuria, frequency and  hematuria.  Musculoskeletal: Negative for back pain, arthralgias and neck pain.  Skin: Negative.   Psychiatric/Behavioral: Negative for hallucinations and confusion.      Allergies  Amoxicillin  Home Medications   Prior to Admission medications   Medication Sig Start Date End Date Taking? Authorizing Provider  azithromycin (ZITHROMAX Z-PAK) 250 MG tablet Take 2 tablets now and then one tablet PO daily until finished for infection Patient not taking: Reported on 08/13/2014 06/19/13   Janne Napoleon, NP  DM-Doxylamine-Acetaminophen (VICKS NYQUIL COLD & FLU) 15-6.25-325 MG/15ML LIQD Take 30 mLs by mouth daily as needed (for flu symptoms).    Historical Provider, MD  ibuprofen (ADVIL,MOTRIN) 800 MG tablet Take 1 tablet (800 mg total) by mouth every 8 (eight) hours as needed for moderate pain. 08/13/14   Benny Lennert, MD  oseltamivir (TAMIFLU) 75 MG capsule Take 1 capsule (75 mg total) by mouth every 12 (twelve) hours. 08/13/14   Benny Lennert, MD  Phenyleph-Promethazine-Cod 5-6.25-10 MG/5ML SYRP Take 5 mLs by mouth every 4 (four) hours as needed. Patient not taking: Reported on 08/13/2014 06/19/13   Janne Napoleon, NP  predniSONE (STERAPRED UNI-PAK) 10 MG tablet Starting 06/20/2013 take 5 tablets by mouth first day then 4, 3, 2, 1 Patient not taking: Reported on 08/13/2014 06/19/13   Janne Napoleon, NP   BP 113/82 mmHg  Pulse 83  Temp(Src) 98.1 F (36.7 C)  Resp 18  Ht  (1.803 m)  Wt 205 lb (92.987 kg)  BMI 28.60 kg/m2  SpO2 98% Physical Exam  Constitutional: He is oriented to person, place, and time. He appears well-developed and well-nourished.  Non-toxic appearance.  HENT:  Head: Normocephalic.  Right Ear: Tympanic membrane and external ear normal.  Left Ear: Tympanic membrane and external ear normal.  Nasal congestion. Soreness to percussion over max sinuses  Eyes: EOM and lids are normal. Pupils are equal, round, and reactive to light.  Neck: Normal range of motion. Neck  supple. Carotid bruit is not present.  Cardiovascular: Normal rate, regular rhythm, normal heart sounds, intact distal pulses and normal pulses.   Pulmonary/Chest: Breath sounds normal. No respiratory distress.  Course breath sounds bilat. Symmetrical rise and fall of the chest. Pt speaks in complete sentences.  Abdominal: Soft. Bowel sounds are normal. There is no tenderness. There is no guarding.  Musculoskeletal: Normal range of motion.  Lymphadenopathy:       Head (right side): No submandibular adenopathy present.       Head (left side): No submandibular adenopathy present.    He has no cervical adenopathy.  Neurological: He is alert and oriented to person, place, and time. He has normal strength. No cranial nerve deficit or sensory deficit.  Skin: Skin is warm and dry.  Psychiatric: He has a normal mood and affect. His speech is normal.  Nursing note and vitals reviewed.   ED Course  Procedures (including critical care time) Labs Review Labs Reviewed - No data to display  Imaging Review No results found.   EKG Interpretation None      MDM  No high temp elevation noted. Chest xray in negative for acute cardiopulmonary disease.  Plan -  Prednisone, claritin D and hycodan cough medication. Pt to return if any acute changes or problem.   Final diagnoses:  Other subacute sinusitis  URI (upper respiratory infection)    **I have reviewed nursing notes, vital signs, and all appropriate lab and imaging results for this patient.Kathie Dike*    Joao Mccurdy M Deseray Daponte, PA-C 09/13/14 0935  Vanetta MuldersScott Zackowski, MD 09/13/14 1010

## 2014-09-11 NOTE — Discharge Instructions (Signed)
Sinusitis °Sinusitis is redness, soreness, and puffiness (inflammation) of the air pockets in the bones of your face (sinuses). The redness, soreness, and puffiness can cause air and mucus to get trapped in your sinuses. This can allow germs to grow and cause an infection.  °HOME CARE  °· Drink enough fluids to keep your pee (urine) clear or pale yellow. °· Use a humidifier in your home. °· Run a hot shower to create steam in the bathroom. Sit in the bathroom with the door closed. Breathe in the steam 3-4 times a day. °· Put a warm, moist washcloth on your face 3-4 times a day, or as told by your doctor. °· Use salt water sprays (saline sprays) to wet the thick fluid in your nose. This can help the sinuses drain. °· Only take medicine as told by your doctor. °GET HELP RIGHT AWAY IF:  °· Your pain gets worse. °· You have very bad headaches. °· You are sick to your stomach (nauseous). °· You throw up (vomit). °· You are very sleepy (drowsy) all the time. °· Your face is puffy (swollen). °· Your vision changes. °· You have a stiff neck. °· You have trouble breathing. °MAKE SURE YOU:  °· Understand these instructions. °· Will watch your condition. °· Will get help right away if you are not doing well or get worse. °Document Released: 01/07/2008 Document Revised: 04/14/2012 Document Reviewed: 02/24/2012 °ExitCare® Patient Information ©2015 [employer]. This information is not intended to replace advice given to you by your health care provider. Make sure you discuss any questions you have with your health care provider. ° °

## 2014-11-27 ENCOUNTER — Emergency Department (INDEPENDENT_AMBULATORY_CARE_PROVIDER_SITE_OTHER)
Admission: EM | Admit: 2014-11-27 | Discharge: 2014-11-27 | Disposition: A | Discharge status: Home / Self Care | Payer: Self-pay | Source: Home / Self Care | Attending: Emergency Medicine | Admitting: Emergency Medicine

## 2014-11-27 ENCOUNTER — Encounter (HOSPITAL_COMMUNITY): Payer: Self-pay | Admitting: Emergency Medicine

## 2014-11-27 DIAGNOSIS — M545 Low back pain, unspecified: Secondary | ICD-10-CM

## 2014-11-27 DIAGNOSIS — G44209 Tension-type headache, unspecified, not intractable: Secondary | ICD-10-CM

## 2014-11-27 MED ORDER — CYCLOBENZAPRINE HCL 10 MG PO TABS: 10.0000 mg | ORAL_TABLET | Freq: Every day | ORAL | Status: DC

## 2014-11-27 MED ORDER — IBUPROFEN 800 MG PO TABS: 800.0000 mg | ORAL_TABLET | Freq: Three times a day (TID) | ORAL | Status: DC | PRN
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
## 2014-11-27 NOTE — Discharge Instructions (Signed)
You have muscle strain in your back and neck causing your pain and headaches. Take ibuprofen 800 mg 3 times a day as needed for pain. Use Flexeril at bedtime. This medicine will make you sleepy. Do gentle stretching exercises. Apply heat 2-3 times a day. If things are getting worse, please go to the emergency room.

## 2014-11-28 ENCOUNTER — Emergency Department (HOSPITAL_COMMUNITY)
Admission: EM | Admit: 2014-11-28 | Discharge: 2014-11-28 | Disposition: A | Discharge status: Home / Self Care | Payer: No Typology Code available for payment source | Attending: Emergency Medicine | Admitting: Emergency Medicine

## 2014-11-28 ENCOUNTER — Encounter (HOSPITAL_COMMUNITY): Payer: Self-pay | Admitting: *Deleted

## 2014-11-28 ENCOUNTER — Emergency Department (HOSPITAL_COMMUNITY): Payer: No Typology Code available for payment source

## 2014-11-28 DIAGNOSIS — G8911 Acute pain due to trauma: Secondary | ICD-10-CM | POA: Diagnosis not present

## 2014-11-28 DIAGNOSIS — M549 Dorsalgia, unspecified: Secondary | ICD-10-CM | POA: Insufficient documentation

## 2014-11-28 DIAGNOSIS — Z87891 Personal history of nicotine dependence: Secondary | ICD-10-CM | POA: Insufficient documentation

## 2014-11-28 DIAGNOSIS — Z862 Personal history of diseases of the blood and blood-forming organs and certain disorders involving the immune mechanism: Secondary | ICD-10-CM | POA: Insufficient documentation

## 2014-11-28 DIAGNOSIS — M62838 Other muscle spasm: Secondary | ICD-10-CM | POA: Diagnosis not present

## 2014-11-28 DIAGNOSIS — Z79899 Other long term (current) drug therapy: Secondary | ICD-10-CM | POA: Diagnosis not present

## 2014-11-28 DIAGNOSIS — M542 Cervicalgia: Secondary | ICD-10-CM | POA: Diagnosis present

## 2014-11-28 DIAGNOSIS — Z880 Allergy status to penicillin: Secondary | ICD-10-CM | POA: Diagnosis not present
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
## 2014-11-28 NOTE — Discharge Instructions (Signed)
Take Flexeril and ibuprofen as needed for pain and spasm. Refer to attached documents for more information. Return to the ED with worsening or concerning symptoms

## 2015-02-21 ENCOUNTER — Emergency Department (INDEPENDENT_AMBULATORY_CARE_PROVIDER_SITE_OTHER)
Admission: EM | Admit: 2015-02-21 | Discharge: 2015-02-21 | Disposition: A | Discharge status: Home / Self Care | Payer: Self-pay | Source: Home / Self Care

## 2015-02-21 ENCOUNTER — Encounter (HOSPITAL_COMMUNITY): Payer: Self-pay | Admitting: *Deleted

## 2015-02-21 DIAGNOSIS — M76899 Other specified enthesopathies of unspecified lower limb, excluding foot: Secondary | ICD-10-CM

## 2015-02-21 DIAGNOSIS — M6580 Other synovitis and tenosynovitis, unspecified site: Secondary | ICD-10-CM

## 2015-02-21 MED ORDER — PREDNISONE 20 MG PO TABS: ORAL_TABLET | ORAL | Status: DC

## 2015-02-21 NOTE — ED Notes (Signed)
Pt  Reports  Symptoms  Of  Pain /  Swelling     r  Knee        Noticed  Today    - pt  Reports   On his  Knees  A  Lot  At  Work

## 2015-02-21 NOTE — Discharge Instructions (Signed)
Repetitive Strain Injuries  Repetitive strain injuries (RSIs) result from overuse or misuse of soft tissues including muscles, tendons, or nerves. Tendons are the cord-like structures that attach muscles to bones. RSIs can affect almost any part of the body. However, RSIs are most common in the arms (thumbs, wrists, elbows, shoulders) and legs (ankles, knees). Common medical conditions that are often caused by repetitive strain include carpal tunnel syndrome, tennis or golfer's elbow, bursitis, and tendonitis. If RSIs are treated early, and therepeated activity is reduced or removed, the severity and length of your problems can usually be reduced. RSIs are also called cumulative trauma disorders (CTD).   CAUSES   Many RSIs occur due to repeating the same activity at work over weeks or months without sufficient rest, such as prolonged typing. RSIs also commonly occur when a hobby or sport is done repeatedly without sufficient rest. RSIs can also occur due to repeated strain or stress on a body part in someone who has one or more risk factors for RSIs.  RISK FACTORS  Workplace risk factors   Frequent computer use, especially if your workstation is not adjusted for your body type.   Infrequent rest breaks.   Working in a high-pressure environment.   Working at a fast pace.   Repeating the same motion, such as frequent typing.   Working in an awkward position or holding the same position for a long time.   Forceful movements such as lifting, pulling, or pushing.   Vibration caused by using power tools.   Working in cold temperatures.   Job stress.  Personal risk factors   Poor posture.   Being loose-jointed.   Not exercising regularly.   Being overweight.   Arthritis, diabetes, thyroid problems, or other long-term (chronic)medical conditions.   Vitamin deficiencies.   Keeping your fingernails long.   An unhealthy, stressful, or inactive lifestyle.   Not sleeping well.  SYMPTOMS   Symptoms often  begin at work but become more noticeable after the repeated stress has ended. For example, you may develop fatigue or soreness in your wrist while typingat work, and at night you may develop numbness and tingling in your fingers. Common symptoms include:    Burning, shooting, or aching pain, especially in the fingers, palms, wrists, forearms, or shoulders.   Tenderness.   Swelling.   Tingling, numbness, or loss of feeling.   Pain with certain activities, such as turning a doorknob or reaching above your head.   Weakness, heaviness, or loss of coordination in yourhand.   Muscle spasms or tightness.  In some cases, symptoms can become so intense that it is difficult to perform everyday tasks. Symptoms that do not improve with rest may indicate a more serious condition.   DIAGNOSIS   Your caregiver may determine the type ofRSI you have based on your medical evaluation and a description of your activities.   TREATMENT   Treatment depends on the severity and type of RSI you have. Your caregiver may recommend rest for the affected body part, medicines, and physical or occupational therapy to reduce pain, swelling, and soreness. Discuss the activities you do repeatedly with your caregiver. Your caregiver can help you decide whether you need to change your activities. An RSI may take months or years to heal, especially if the affected body part gets insufficient rest. In some cases, such as severe carpal tunnel syndrome, surgery may be recommended.  PREVENTION   Talk with your supervisor to make sure you have the proper equipment   for your work station.   Maintain good posture at your desk or work station with:   Feet flat on the floor.   Knees directly over the feet, bent at a right angle.   Lower back supported by your chair or a cushion in the curve of your lower back.   Shoulders and arms relaxed and at your sides.   Neck relaxed and not bent forwards or backwards.   Your desk and computer workstation  properly adjusted to your body type.   Your chair adjusted so there is no excess pressure on the back of your thighs.   The keyboard resting above your thighs. You should be able to reach the keys with your elbows at your side, bent at a right angle. Your arms should be supported on forearm rests, with your forearms parallel to the ground.   The computer mouse within easy reach.   The monitor directly in front of you, so that your eyes are aligned with the top of the screen. The screen should be about 15 to 25 inches from your eyes.   While typing, keep your wrist straight, in a neutral position. Move your entire arm when you move your mouse or when typing hard-to-reach keys.   Only use your computer as much as you need to for work. Do not use it during breaks.   Take breaks often from any repeated activity. Alternate with another task which requires you to use different muscles, or rest at least once every hour.   Change positions regularly. If you spend a lot of time sitting, get up, walk around, and stretch.   Do not hold pens or pencils tightly when writing.   Exercise regularly.   Maintain a normal weight.   Eat a diet with plenty of vegetables, whole grains, and fruit.   Get sufficient, restful sleep.  HOME CARE INSTRUCTIONS   If your caregiver prescribed medicine to help reduce swelling, take it as directed.   Only take over-the-counter or prescription medicines for pain, discomfort, or fever as directed by your caregiver.   Reduce, and if needed, stopthe activities that are causing your problems until you have no further symptoms.If your symptoms are work-related, you may need to talk to your supervisor about changing your activities.   When symptoms develop, put ice or a cold pack on the aching area.   Put ice in a plastic bag.   Place a towel between your skin and the bag.   Leave the ice on for 15-20 minutes.   If you were given a splint to keep your wrist from bending, wear it as  instructed. It is important to wear the splint at night. Use the splint for as long as your caregiver recommends.  SEEK MEDICAL CARE IF:   You develop new problems.   Your problems do not get better with medicine.  MAKE SURE YOU:   Understand these instructions.   Will watch your condition.   Will get help right away if you are not doing well or get worse.  Document Released: 07/11/2002 Document Revised: 01/20/2012 Document Reviewed: 09/11/2011  [employer] Patient Information 2015 [employer]. This information is not intended to replace advice given to you by your health care provider. Make sure you discuss any questions you have with your health care provider.

## 2015-02-21 NOTE — ED Provider Notes (Signed)
CSN: [id number]     Arrival date & time 02/21/15  1400 History   None    No chief complaint on file.  (Consider location/radiation/quality/duration/timing/severity/associated sxs/prior Treatment) The history is provided by the patient.   this is a 34 year old man who presents with right knee pain. This been going on for about one day. He recently changed his employment from a job involving kneeling to 1 involves standing all day. He has 12 hour shifts. He works at [employer]  He has no other joint pains, no fever, no trauma, no no no activities other than his job.  Past Medical History  Diagnosis Date  . Sickle cell trait    Past Surgical History  Procedure Laterality Date  . Hernia repair    . Tooth extraction    . Umbilical hernia repair  05/24/2012    Procedure: HERNIA REPAIR UMBILICAL ADULT;  Surgeon: Marlane Hatcher, MD;  Location: [facility];  Service: General;  Laterality: N/A;   Family History  Problem Relation Age of Onset  . Diabetes Mother   . Stroke Father    History  Substance Use Topics  . Smoking status: Former Smoker -- 1.00 packs/day for 7 years    Types: Cigarettes    Quit date: 05/21/1986  . Smokeless tobacco: Never Used  . Alcohol Use: No    Review of Systems  Constitutional: Negative.   HENT: Negative.   Eyes: Negative.   Respiratory: Negative.   Cardiovascular: Negative.   Genitourinary: Negative.   Musculoskeletal: Positive for myalgias and arthralgias.  Psychiatric/Behavioral: Negative.     Allergies  Amoxicillin  Home Medications   Prior to Admission medications   Medication Sig Start Date End Date Taking? Authorizing Provider  cyclobenzaprine (FLEXERIL) 10 MG tablet Take 1 tablet (10 mg total) by mouth at bedtime. 11/27/14   Charm Rings, MD  HYDROcodone-homatropine (HYCODAN) 5-1.5 MG/5ML syrup Take 5 mLs by mouth every 6 (six) hours as needed for cough. 09/11/14   Ivery Quale, PA-C  ibuprofen (ADVIL,MOTRIN) 800 MG tablet  Take 1 tablet (800 mg total) by mouth every 8 (eight) hours as needed for moderate pain. 11/27/14   Charm Rings, MD  loratadine-pseudoephedrine (CLARITIN-D 12 HOUR) 5-120 MG per tablet Take 1 tablet by mouth 2 (two) times daily. 09/11/14   Ivery Quale, PA-C  Phenylephrine-APAP-Guaifenesin (MUCINEX FAST-MAX COLD & SINUS PO) Take 1 tablet by mouth 2 (two) times daily as needed (cold).    Historical Provider, MD  predniSONE (DELTASONE) 20 MG tablet Two daily with food 02/21/15   Elvina Sidle, MD  pseudoephedrine (SUDAFED) 30 MG tablet Take 30 mg by mouth 2 (two) times daily as needed for congestion.    Historical Provider, MD   BP 114/80 mmHg  Pulse 83  Temp(Src) 98.4 F (36.9 C) (Oral)  Resp 16  SpO2 96% Physical Exam  Constitutional: He is oriented to person, place, and time. He appears well-developed.  HENT:  Head: Normocephalic and atraumatic.  Right Ear: External ear normal.  Left Ear: External ear normal.  Mouth/Throat: Oropharynx is clear and moist.  Eyes: Conjunctivae and EOM are normal. Pupils are equal, round, and reactive to light.  Neck: Neck supple.  Cardiovascular: Normal rate.   Pulmonary/Chest: Effort normal.  Musculoskeletal: Normal range of motion.  Tender medial aspect of the proximal tibia on the right, no effusion, full range of motion of the knee, nontender thigh, normal inspection of skin over the right knee.  There is no swelling of the  right knee  Neurological: He is alert and oriented to person, place, and time.  Nursing note and vitals reviewed.   ED Course  Procedures (including critical care time) Labs Review Labs Reviewed - No data to display  Imaging Review No results found.   MDM   1. Tendonitis of knee        ICD-9-CM ICD-10-CM   1. Tendonitis of knee 727.09 M65.80 predniSONE (DELTASONE) 20 MG tablet     Signed, Elvina SidleKurt Isobella Ascher, MD     Elvina SidleKurt Ayline Dingus, MD 02/21/15 [phone]

## 2015-03-20 ENCOUNTER — Emergency Department (HOSPITAL_COMMUNITY)
Admission: EM | Admit: 2015-03-20 | Discharge: 2015-03-20 | Disposition: A | Discharge status: Home / Self Care | Payer: No Typology Code available for payment source

## 2015-03-20 NOTE — ED Notes (Signed)
The pt is tired of waiting he told registration that he was going to [facility] ed. He still needs to be triaged

## 2015-03-21 ENCOUNTER — Emergency Department (HOSPITAL_COMMUNITY)
Admission: EM | Admit: 2015-03-21 | Discharge: 2015-03-21 | Disposition: A | Discharge status: Home / Self Care | Payer: No Typology Code available for payment source | Attending: Emergency Medicine | Admitting: Emergency Medicine

## 2015-03-21 ENCOUNTER — Encounter (HOSPITAL_COMMUNITY): Payer: Self-pay | Admitting: Emergency Medicine

## 2015-03-21 ENCOUNTER — Emergency Department (HOSPITAL_COMMUNITY): Payer: No Typology Code available for payment source

## 2015-03-21 DIAGNOSIS — S4992XA Unspecified injury of left shoulder and upper arm, initial encounter: Secondary | ICD-10-CM | POA: Insufficient documentation

## 2015-03-21 DIAGNOSIS — S59902A Unspecified injury of left elbow, initial encounter: Secondary | ICD-10-CM | POA: Insufficient documentation

## 2015-03-21 DIAGNOSIS — S6992XA Unspecified injury of left wrist, hand and finger(s), initial encounter: Secondary | ICD-10-CM | POA: Insufficient documentation

## 2015-03-21 DIAGNOSIS — Z880 Allergy status to penicillin: Secondary | ICD-10-CM | POA: Diagnosis not present

## 2015-03-21 DIAGNOSIS — Y9389 Activity, other specified: Secondary | ICD-10-CM | POA: Insufficient documentation

## 2015-03-21 DIAGNOSIS — Y92410 Unspecified street and highway as the place of occurrence of the external cause: Secondary | ICD-10-CM | POA: Insufficient documentation

## 2015-03-21 DIAGNOSIS — Z87891 Personal history of nicotine dependence: Secondary | ICD-10-CM | POA: Diagnosis not present

## 2015-03-21 DIAGNOSIS — Y998 Other external cause status: Secondary | ICD-10-CM | POA: Insufficient documentation

## 2015-03-21 DIAGNOSIS — S0990XA Unspecified injury of head, initial encounter: Secondary | ICD-10-CM | POA: Insufficient documentation

## 2015-03-21 DIAGNOSIS — Z862 Personal history of diseases of the blood and blood-forming organs and certain disorders involving the immune mechanism: Secondary | ICD-10-CM | POA: Insufficient documentation

## 2015-03-21 DIAGNOSIS — S59912A Unspecified injury of left forearm, initial encounter: Secondary | ICD-10-CM | POA: Diagnosis not present

## 2015-03-21 MED ORDER — METHOCARBAMOL 500 MG PO TABS: 500.0000 mg | ORAL_TABLET | Freq: Two times a day (BID) | ORAL | Status: DC

## 2015-03-21 MED ORDER — IBUPROFEN 800 MG PO TABS: 800.0000 mg | ORAL_TABLET | Freq: Three times a day (TID) | ORAL | Status: DC | PRN
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
## 2015-03-21 NOTE — Discharge Instructions (Signed)
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
## 2019-04-27 ENCOUNTER — Emergency Department (HOSPITAL_COMMUNITY)
Admission: EM | Admit: 2019-04-27 | Discharge: 2019-04-27 | Disposition: A | Discharge status: Home / Self Care | Payer: Self-pay | Attending: Emergency Medicine | Admitting: Emergency Medicine

## 2019-04-27 ENCOUNTER — Encounter (HOSPITAL_COMMUNITY): Payer: Self-pay

## 2019-04-27 ENCOUNTER — Other Ambulatory Visit: Payer: Self-pay

## 2019-04-27 ENCOUNTER — Emergency Department (HOSPITAL_COMMUNITY): Payer: Self-pay

## 2019-04-27 DIAGNOSIS — Z79899 Other long term (current) drug therapy: Secondary | ICD-10-CM | POA: Insufficient documentation

## 2019-04-27 DIAGNOSIS — R05 Cough: Secondary | ICD-10-CM | POA: Insufficient documentation

## 2019-04-27 DIAGNOSIS — R059 Cough, unspecified: Secondary | ICD-10-CM

## 2019-04-27 DIAGNOSIS — Z87891 Personal history of nicotine dependence: Secondary | ICD-10-CM | POA: Insufficient documentation

## 2019-04-27 DIAGNOSIS — Z20828 Contact with and (suspected) exposure to other viral communicable diseases: Secondary | ICD-10-CM | POA: Insufficient documentation

## 2019-04-27 DIAGNOSIS — R51 Headache: Secondary | ICD-10-CM | POA: Insufficient documentation

## 2019-04-27 MED ORDER — BENZONATATE 100 MG PO CAPS: 100.0000 mg | ORAL_CAPSULE | Freq: Three times a day (TID) | ORAL | 0 refills | Status: DC

## 2019-04-27 NOTE — ED Triage Notes (Signed)
Patient complains of 5 days of cough, fever and headache, describes as nonproductive and NAD

## 2019-04-27 NOTE — Discharge Instructions (Addendum)
°If you live with, or provide care at home for, a person confirmed to have, or being evaluated for, COVID-19 infection °please follow these guidelines to prevent infection: ° °Follow healthcare provider’s instructions °Make sure that you understand and can help the patient follow any healthcare provider instructions for all care. ° °Provide for the patient’s basic needs °You should help the patient with basic needs in the home and provide support for getting groceries, prescriptions, and °other personal needs. ° °Monitor the patient’s symptoms °If they are getting sicker, call his or her medical provider a ° This will help the healthcare provider’s office take steps to keep other people from getting infected. °Ask the healthcare provider to call the local or state health department. ° °Limit the number of people who have contact with the patient °If possible, have only one caregiver for the patient. °Other household members should stay in another home or place of residence. If this is not possible, they should stay °in another room, or be separated from the patient as much as possible. Use a separate bathroom, if available. °Restrict visitors who do not have an essential need to be in the home. ° °Keep older adults, very young children, and other sick people away from the patient °Keep older adults, very young children, and those who have compromised immune systems or chronic health conditions away from the patient. This includes people with chronic heart, lung, or kidney conditions, diabetes, and cancer. ° °Ensure good ventilation °Make sure that shared spaces in the home have good air flow, such as from an air conditioner or an opened window, °weather permitting. ° °Wash your hands often °Wash your hands often and thoroughly with soap and water for at least 20 seconds. You can use an alcohol based hand sanitizer if soap and water are not available and if your hands are not visibly dirty. °Avoid touching your  eyes, nose, and mouth with unwashed hands. °Use disposable paper towels to dry your hands. If not available, use dedicated cloth towels and replace them when they become wet. ° °Wear a facemask and gloves °Wear a disposable facemask at all times in the room and gloves when you touch or have contact with the patient’s blood, body fluids, and/or secretions or excretions, such as sweat, saliva, sputum, nasal mucus, vomit, urine, or feces.  Ensure the mask fits over your nose and mouth tightly, and do not touch it during use. °Throw out disposable facemasks and gloves after using them. Do not reuse. °Wash your hands immediately after removing your facemask and gloves. °If your personal clothing becomes contaminated, carefully remove clothing and launder. Wash your hands after handling contaminated clothing. °Place all used disposable facemasks, gloves, and other waste in a lined container before disposing them with other household waste. °Remove gloves and wash your hands immediately after handling these items. ° °Do not share dishes, glasses, or other household items with the patient °Avoid sharing household items. You should not share dishes, drinking glasses, cups, eating utensils, towels, bedding, or other items °After the person uses these items, you should wash them thoroughly with soap and water. ° °Wash laundry thoroughly °Immediately remove and wash clothes or bedding that have blood, body fluids, and/or secretions or excretions, such as sweat, saliva, sputum, nasal mucus, vomit, urine, or feces, on them. °Wear gloves when handling laundry from the patient. °Read and follow directions on labels of laundry or clothing items and detergent. In general, wash and dry with the warmest temperatures recommended on the   label. ° °Clean all areas the individual has used often °Clean all touchable surfaces, such as counters, tabletops, doorknobs, bathroom fixtures, toilets, phones, keyboards, tablets, and bedside tables,  every day. Also, clean any surfaces that may have blood, body fluids, and/or secretions or excretions on them. °Wear gloves when cleaning surfaces the patient has come in contact with. °Use a diluted bleach solution (e.g., dilute bleach with 1 part bleach and 10 parts water) or a household disinfectant with a label that says EPA-registered for coronaviruses. To make a bleach solution at home, add 1 tablespoon of bleach to 1 quart (4 cups) of water. For a larger supply, add ¼ cup of bleach to 1 gallon (16 cups) of water. °Read labels of cleaning products and follow recommendations provided on product labels. Labels contain instructions for safe and effective use of the cleaning product including precautions you should take when applying the product, such as wearing gloves or eye protection and making sure you have good ventilation during use of the product. °Remove gloves and wash hands immediately after cleaning. ° °Monitor yourself for signs and symptoms of illness °Caregivers and household members are considered close contacts, should monitor their health, and will be asked to limit °movement outside of the home to the extent possible. Follow the monitoring steps for close contacts listed on the °symptom monitoring form. ° ° °? If you have additional questions, contact your local health department or call the epidemiologist on call at [phone] °(available 24/7). °? This guidance is subject to change. For the most up-to-date guidance from CDC, please refer to their website: °https://www.cdc.gov/coronavirus/2019-ncov/hcp/guidance-prevent-spread.html  ° °

## 2019-04-27 NOTE — ED Provider Notes (Signed)
[facility] EMERGENCY DEPARTMENT Provider Note   CSN: [id number] Arrival date & time: 04/27/19  1542     History   Chief Complaint Chief Complaint  Patient presents with  . cough, fever, headache    HPI Dennis Travis is a 38 y.o. male with no relevant past medical history presents to the ED with a 5-day history of fever, headache, loose stools, and nonproductive cough.  Patient reports that he works in a [employer] and there have been 5 boys who have tested positive for COVID-19 the past few weeks.  Yesterday when he tried to go to work, he was turned away at the door due to a fever.  He does not remember exactly what temperature was, nor does he have a thermometer at home.  He reports approximately 3 loose stools per day each of the previous 5 days and that he has left-sided rib discomfort due to and associated with his persistent coughing.  He also complains of soaking his bedsheets in sweat and having difficulty falling asleep due to his cough.  He denies any chest pain, short of breath, nausea, vomiting, abdominal pain, or urinary symptoms.     HPI  Past Medical History:  Diagnosis Date  . Sickle cell trait (HCC)     There are no active problems to display for this patient.   Past Surgical History:  Procedure Laterality Date  . HERNIA REPAIR    . TOOTH EXTRACTION    . UMBILICAL HERNIA REPAIR  05/24/2012   Procedure: HERNIA REPAIR UMBILICAL ADULT;  Surgeon: Marlane Hatcher, MD;  Location: [facility];  Service: General;  Laterality: N/A;        Home Medications    Prior to Admission medications   Medication Sig Start Date End Date Taking? Authorizing Provider  benzonatate (TESSALON) 100 MG capsule Take 1 capsule (100 mg total) by mouth every 8 (eight) hours. 04/27/19   Lorelee New, PA-C  ibuprofen (ADVIL,MOTRIN) 800 MG tablet Take 1 tablet (800 mg total) by mouth every 8 (eight) hours as needed for moderate pain. 03/21/15   Fayrene Helper,  PA-C  methocarbamol (ROBAXIN) 500 MG tablet Take 1 tablet (500 mg total) by mouth 2 (two) times daily. 03/21/15   Fayrene Helper, PA-C    Family History Family History  Problem Relation Age of Onset  . Diabetes Mother   . Stroke Father     Social History Social History   Tobacco Use  . Smoking status: Former Smoker    Packs/day: 1.00    Years: 7.00    Pack years: 7.00    Types: Cigarettes    Quit date: 05/21/1986    Years since quitting: 32.9  . Smokeless tobacco: Never Used  Substance Use Topics  . Alcohol use: No  . Drug use: No     Allergies   Amoxicillin   Review of Systems Review of Systems  All other systems reviewed and are negative.    Physical Exam Updated Vital Signs BP 118/73 (BP Location: Left Arm)   Pulse 97   Temp 98.8 F (37.1 C) (Oral)   Resp 16   SpO2 97%   Physical Exam Vitals signs and nursing note reviewed. Exam conducted with a chaperone present.  Constitutional:      Appearance: Normal appearance.  HENT:     Head: Normocephalic and atraumatic.  Eyes:     General: No scleral icterus.    Conjunctiva/sclera: Conjunctivae normal.  Neck:  Musculoskeletal: Normal range of motion. No neck rigidity.  Cardiovascular:     Rate and Rhythm: Normal rate and regular rhythm.     Pulses: Normal pulses.  Pulmonary:     Effort: Pulmonary effort is normal. No respiratory distress.     Breath sounds: No wheezing.  Skin:    General: Skin is dry.  Neurological:     Mental Status: He is alert and oriented to person, place, and time.     GCS: GCS eye subscore is 4. GCS verbal subscore is 5. GCS motor subscore is 6.  Psychiatric:        Mood and Affect: Mood normal.        Behavior: Behavior normal.        Thought Content: Thought content normal.      ED Treatments / Results  Labs (all labs ordered are listed, but only abnormal results are displayed) Labs Reviewed  NOVEL CORONAVIRUS, NAA (HOSP ORDER, SEND-OUT TO REF LAB; TAT 18-24 HRS)     EKG None  Radiology Dg Chest Portable 1 View  Result Date: 04/27/2019 CLINICAL DATA:  Cough and fever. EXAM: PORTABLE CHEST 1 VIEW COMPARISON:  09/11/2014 and chest CT from 01/10/2010 FINDINGS: There is a persistent mass-like lesion in the left hilar region which has been present on multiple prior examinations. Difficult to assess for significant change in this lesion. Remainder of the lungs are clear. Heart size is normal. Trachea is midline. No acute bone abnormality. IMPRESSION: 1. No acute chest findings. 2. No significant change in the chronic left hilar lesion. Electronically Signed   By: Markus Daft M.D.   On: 04/27/2019 17:00    Procedures Procedures (including critical care time)  Medications Ordered in ED Medications - No data to display   Initial Impression / Assessment and Plan / ED Course  I have reviewed the triage vital signs and the nursing notes.  Pertinent labs & imaging results that were available during my care of the patient were reviewed by me and considered in my medical decision making (see chart for details).       Patient's history and reported sick contacts concerning for COVID-19 infection.  Obtained chest x-ray to rule out pneumonia which did not reveal any consolidation or evidence of other acute cardiopulmonary processes.  Patient is oxygenating well on room air and is hemodynamically stable.  He is not in respiratory distress and can tolerate food and liquid by mouth.  His fever is controlled right now with Tylenol that he had taken earlier.  Advised to take ibuprofen to help with his left-sided discomfort which is more likely musculoskeletal to his cough.  Prescribed him [employer] as an antitussive since the OTC medications that he has been using have been insufficient.  Emphasized the importance of increased oral hydration in the context of his viral infection and loose stools.  Return to the ED or seek medical attention if you develop respiratory  difficulty, fever uncontrolled by Tylenol and ibuprofen, uncontrolled nausea vomiting, inability to tolerate food or liquid by mouth, new neurologic deficits, chest pain, or other new or worsening symptoms. All of the evaluation and work-up results were discussed with the patient at bedside. They were provided opportunity to ask any additional questions and have none at this time. They have expressed understanding of verbal discharge instructions as well as return precautions and are agreeable to the plan.     Final Clinical Impressions(s) / ED Diagnoses   Final diagnoses:  Cough  ED Discharge Orders         Ordered    benzonatate (TESSALON) 100 MG capsule  Every 8 hours     04/27/19 1656           Lorelee New, PA-C 04/27/19 1736    Margarita Grizzle, MD 04/28/19 [phone]

## 2019-04-28 LAB — NOVEL CORONAVIRUS, NAA (HOSP ORDER, SEND-OUT TO REF LAB; TAT 18-24 HRS): SARS-CoV-2, NAA: NOT DETECTED

## 2019-07-15 ENCOUNTER — Emergency Department (HOSPITAL_BASED_OUTPATIENT_CLINIC_OR_DEPARTMENT_OTHER)
Admission: EM | Admit: 2019-07-15 | Discharge: 2019-07-15 | Disposition: A | Discharge status: Home / Self Care | Payer: Self-pay | Attending: Emergency Medicine | Admitting: Emergency Medicine

## 2019-07-15 ENCOUNTER — Other Ambulatory Visit: Payer: Self-pay

## 2019-07-15 ENCOUNTER — Encounter (HOSPITAL_BASED_OUTPATIENT_CLINIC_OR_DEPARTMENT_OTHER): Payer: Self-pay | Admitting: *Deleted

## 2019-07-15 ENCOUNTER — Emergency Department (HOSPITAL_BASED_OUTPATIENT_CLINIC_OR_DEPARTMENT_OTHER): Payer: Self-pay

## 2019-07-15 DIAGNOSIS — L600 Ingrowing nail: Secondary | ICD-10-CM

## 2019-07-15 DIAGNOSIS — Z87891 Personal history of nicotine dependence: Secondary | ICD-10-CM | POA: Insufficient documentation

## 2019-07-15 DIAGNOSIS — B351 Tinea unguium: Secondary | ICD-10-CM

## 2019-07-15 MED ORDER — MUPIROCIN CALCIUM 2 % EX CREA
TOPICAL_CREAM | Freq: Once | CUTANEOUS | Status: DC
  Filled 2019-07-15: qty 15

## 2019-07-15 MED ORDER — TERBINAFINE HCL 1 % EX CREA
1.0000 | TOPICAL_CREAM | Freq: Two times a day (BID) | CUTANEOUS | 0 refills | Status: AC
Start: 2019-07-15 — End: ?

## 2019-07-15 MED ORDER — BUPIVACAINE HCL (PF) 0.5 % IJ SOLN
10.0000 mL | Freq: Once | INTRAMUSCULAR | Status: AC
  Administered 2019-07-15: 10 mL
  Filled 2019-07-15: qty 10

## 2019-07-15 MED ORDER — MUPIROCIN 2 % EX OINT
TOPICAL_OINTMENT | CUTANEOUS | Status: AC
  Administered 2019-07-15: 10:00:00 via TOPICAL
  Filled 2019-07-15: qty 22

## 2019-07-15 MED ORDER — SILVER NITRATE-POT NITRATE 75-25 % EX MISC
CUTANEOUS | Status: AC
  Filled 2019-07-15: qty 10

## 2019-07-15 MED ORDER — SILVER NITRATE-POT NITRATE 75-25 % EX MISC
1.0000 "application " | Freq: Once | CUTANEOUS | Status: AC
  Administered 2019-07-15: 1 via TOPICAL
  Filled 2019-07-15: qty 10

## 2019-07-15 NOTE — ED Triage Notes (Signed)
Hit his great toe to the bed a month ago.  Right great toe is bruised.

## 2019-07-15 NOTE — ED Provider Notes (Signed)
[facility] EMERGENCY DEPARTMENT Provider Note   CSN: [id number] Arrival date & time: 07/15/19  [id number]     History Chief Complaint  Patient presents with  . Toe Injury    Dennis Travis is a 38 y.o. male.  Pt presents to the ED today with right great toe pain.  Pt said he has an ingrown toe nail and he hit it on the bed post about 1 month ago.  It continues to have pain and bleed.  He has pain even with putting on his shoes.        Past Medical History:  Diagnosis Date  . Sickle cell trait (HCC)     There are no problems to display for this patient.   Past Surgical History:  Procedure Laterality Date  . HERNIA REPAIR    . TOOTH EXTRACTION    . UMBILICAL HERNIA REPAIR  05/24/2012   Procedure: HERNIA REPAIR UMBILICAL ADULT;  Surgeon: Marlane HatcherWilliam S Bradford, MD;  Location: [facility];  Service: General;  Laterality: N/A;       Family History  Problem Relation Age of Onset  . Diabetes Mother   . Stroke Father     Social History   Tobacco Use  . Smoking status: Former Smoker    Packs/day: 1.00    Years: 7.00    Pack years: 7.00    Types: Cigarettes    Quit date: 05/21/1986    Years since quitting: 33.1  . Smokeless tobacco: Never Used  Substance Use Topics  . Alcohol use: No  . Drug use: No    Home Medications Prior to Admission medications   Medication Sig Start Date End Date Taking? Authorizing Provider  terbinafine (LAMISIL AT) 1 % cream Apply 1 application topically 2 (two) times daily. 07/15/19   Jacalyn LefevreHaviland, Juliet Vasbinder, MD    Allergies    Amoxicillin  Review of Systems   Review of Systems  Musculoskeletal:       Great toe pain on right  All other systems reviewed and are negative.   Physical Exam Updated Vital Signs BP 123/88 (BP Location: Right Arm)   Pulse 71   Temp 97.9 F (36.6 C) (Oral)   Resp 16   Ht 5\' 11"  (1.803 m)   Wt 86.2 kg   SpO2 98%   BMI 26.50 kg/m   Physical Exam Vitals and nursing note reviewed.  Constitutional:        Appearance: Normal appearance.  HENT:     Head: Normocephalic and atraumatic.     Right Ear: External ear normal.     Left Ear: External ear normal.     Nose: Nose normal.     Mouth/Throat:     Mouth: Mucous membranes are moist.     Pharynx: Oropharynx is clear.  Eyes:     Extraocular Movements: Extraocular movements intact.     Conjunctiva/sclera: Conjunctivae normal.     Pupils: Pupils are equal, round, and reactive to light.  Cardiovascular:     Rate and Rhythm: Normal rate and regular rhythm.     Pulses: Normal pulses.     Heart sounds: Normal heart sounds.  Pulmonary:     Effort: Pulmonary effort is normal.     Breath sounds: Normal breath sounds.  Abdominal:     General: Abdomen is flat. Bowel sounds are normal.     Palpations: Abdomen is soft.  Musculoskeletal:     Cervical back: Normal range of motion and neck supple.  Comments: Right great toe with onychomycosis and an ingrown toenail.  Skin:    General: Skin is warm.     Capillary Refill: Capillary refill takes less than 2 seconds.  Neurological:     General: No focal deficit present.     Mental Status: He is alert and oriented to person, place, and time.  Psychiatric:        Mood and Affect: Mood normal.        Behavior: Behavior normal.        Thought Content: Thought content normal.        Judgment: Judgment normal.     ED Results / Procedures / Treatments   Labs (all labs ordered are listed, but only abnormal results are displayed) Labs Reviewed - No data to display  EKG None  Radiology DG Toe Great Right  Result Date: 07/15/2019 CLINICAL DATA:  Sickle cell trait.  Toe pain. EXAM: RIGHT GREAT TOE COMPARISON:  None. FINDINGS: There is no evidence of fracture or dislocation. Osteophyte is identified in the proximal aspect of the first distal phalanx. Soft tissues are unremarkable. IMPRESSION: No acute fracture or dislocation. Electronically Signed   By: Sherian Rein M.D.   On: 07/15/2019 08:20     Procedures Excise ingrown toenail  Date/Time: 07/15/2019 9:34 AM Performed by: Jacalyn Lefevre, MD Authorized by: Jacalyn Lefevre, MD  Consent: Verbal consent obtained. Consent given by: patient Patient understanding: patient states understanding of the procedure being performed Patient identity confirmed: verbally with patient Time out: Immediately prior to procedure a "time out" was called to verify the correct patient, procedure, equipment, support staff and site/side marked as required. Preparation: Patient was prepped and draped in the usual sterile fashion. Local anesthesia used: yes Anesthesia: digital block  Anesthesia: Local anesthesia used: yes Local Anesthetic: bupivacaine 0.5% without epinephrine Anesthetic total: 8 mL  Sedation: Patient sedated: no  Patient tolerance: patient tolerated the procedure well with no immediate complications Comments: A piece of the toenail was removed.  The excess granulation tissue was removed.  Silver nitrate applied to toenail bed.    (including critical care time)  Medications Ordered in ED Medications  mupirocin cream (BACTROBAN) 2 % ( Topical Not Given 07/15/19 0933)  silver nitrate applicators 75-25 % applicator (has no administration in time range)  bupivacaine (MARCAINE) 0.5 % injection 10 mL (10 mLs Infiltration Given by Other 07/15/19 6967)  silver nitrate applicators applicator 1 application (1 application Topical Given by Other 07/15/19 0854)  mupirocin ointment (BACTROBAN) 2 % ( Topical Given 07/15/19 0934)    ED Course  I have reviewed the triage vital signs and the nursing notes.  Pertinent labs & imaging results that were available during my care of the patient were reviewed by me and considered in my medical decision making (see chart for details).    MDM Rules/Calculators/A&P     CHA2DS2/VAS Stroke Risk Points      N/A >= 2 Points: High Risk  1 - 1.99 Points: Medium Risk  0 Points: Low Risk    A final  score could not be computed because of missing components.: Last  Change: N/A     This score determines the patient's risk of having a stroke if the  patient has atrial fibrillation.      This score is not applicable to this patient. Components are not  calculated.                   Pt is told to f/u  with podiatry.  Return if worse.   Final Clinical Impression(s) / ED Diagnoses Final diagnoses:  Ingrown nail of great toe of right foot  Onychomycosis    Rx / DC Orders ED Discharge Orders         Ordered    terbinafine (LAMISIL AT) 1 % cream  2 times daily     07/15/19 0908           Isla Pence, MD 07/15/19 [phone]

## 2019-09-06 ENCOUNTER — Emergency Department (HOSPITAL_COMMUNITY): Payer: Self-pay

## 2019-09-06 ENCOUNTER — Emergency Department (HOSPITAL_COMMUNITY)
Admission: EM | Admit: 2019-09-06 | Discharge: 2019-09-06 | Disposition: A | Discharge status: Home / Self Care | Payer: Self-pay | Attending: Emergency Medicine | Admitting: Emergency Medicine

## 2019-09-06 ENCOUNTER — Encounter (HOSPITAL_COMMUNITY): Payer: Self-pay | Admitting: Emergency Medicine

## 2019-09-06 ENCOUNTER — Other Ambulatory Visit: Payer: Self-pay

## 2019-09-06 DIAGNOSIS — M79674 Pain in right toe(s): Secondary | ICD-10-CM | POA: Insufficient documentation

## 2019-09-06 DIAGNOSIS — Z79899 Other long term (current) drug therapy: Secondary | ICD-10-CM | POA: Insufficient documentation

## 2019-09-06 DIAGNOSIS — Z87891 Personal history of nicotine dependence: Secondary | ICD-10-CM | POA: Insufficient documentation

## 2019-09-06 MED ORDER — LIDOCAINE 4 % EX CREA: 1.0000 "application " | TOPICAL_CREAM | CUTANEOUS | 0 refills | Status: AC | PRN

## 2019-09-06 NOTE — ED Provider Notes (Signed)
[facility] EMERGENCY DEPARTMENT Provider Note   CSN: [id number] Arrival date & time: 09/06/19  1738     History Chief Complaint  Patient presents with  . Toe Pain    Dennis Travis is a 39 y.o. male.  HPI      Dennis Travis is a 39 y.o. male, patient with no pertinent past medical history presenting to the ED with right big toe pain for the last 3 months.  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  He also has noted some sensitivity to the medial nail and thinks he may have an ingrown toenail. Denies fever, drainage, swelling, numbness, or any other complaints.    Past Medical History:  Diagnosis Date  . Sickle cell trait ([place])     There are no problems to display for this patient.   Past Surgical History:  Procedure Laterality Date  . HERNIA REPAIR    . TOOTH EXTRACTION    . UMBILICAL HERNIA REPAIR  05/24/2012   Procedure: HERNIA REPAIR UMBILICAL ADULT;  Surgeon: Scherry Ran, MD;  Location: [facility];  Service: General;  Laterality: N/A;       Family History  Problem Relation Age of Onset  . Diabetes Mother   . Stroke Father     Social History   Tobacco Use  . Smoking status: Former Smoker    Packs/day: 1.00    Years: 7.00    Pack years: 7.00    Types: Cigarettes    Quit date: 05/21/1986    Years since quitting: 33.3  . Smokeless tobacco: Never Used  Substance Use Topics  . Alcohol use: No  . Drug use: No    Home Medications Prior to Admission medications   Medication Sig Start Date End Date Taking? Authorizing Provider  lidocaine (LMX) 4 % cream Apply 1 application topically as needed. 09/06/19   Marquetta Weiskopf C, PA-C  terbinafine (LAMISIL AT) 1 % cream Apply 1 application topically 2 (two) times daily. 07/15/19   Isla Pence, MD    Allergies    Amoxicillin  Review of Systems   Review of Systems  Constitutional: Negative for fever.  Musculoskeletal:  Positive for arthralgias.  Neurological: Negative for weakness and numbness.    Physical Exam Updated Vital Signs BP (!) 130/99 (BP Location: Right Arm)   Pulse 90   Temp 98.2 F (36.8 C) (Oral)   Resp 16   SpO2 98%   Physical Exam Vitals and nursing note reviewed.  Constitutional:      General: He is not in acute distress.    Appearance: He is well-developed. He is not diaphoretic.  HENT:     Head: Normocephalic and atraumatic.  Eyes:     Conjunctiva/sclera: Conjunctivae normal.  Cardiovascular:     Rate and Rhythm: Normal rate and regular rhythm.     Pulses:          Dorsalis pedis pulses are 2+ on the right side.       Posterior tibial pulses are 2+ on the right side.  Pulmonary:     Effort: Pulmonary effort is normal.  Musculoskeletal:     Cervical back: Neck supple.     Comments: Tenderness to the right medial big toe without swelling, erythema, wound, or deformity.  No tenderness or lifting to the nail.  Nail appears to be secure. There is some darkening that may be bruising to the  skin over the IP joint.  No tenderness or swelling in this region. No pain or tenderness into the foot. Full range of motion in the toes of the right foot.  Skin:    General: Skin is warm and dry.     Capillary Refill: Capillary refill takes less than 2 seconds.     Coloration: Skin is not pale.  Neurological:     Mental Status: He is alert.     Comments: Sensation to light touch grossly intact in the toes of the right foot. Strength 5/5 in the right foot and ankle. Ambulatory without gait abnormality.  Psychiatric:        Behavior: Behavior normal.     ED Results / Procedures / Treatments   Labs (all labs ordered are listed, but only abnormal results are displayed) Labs Reviewed - No data to display  EKG None  Radiology DG Toe Great Right  Result Date: 09/06/2019 CLINICAL DATA:  First toe pain for 3 days, possible ingrown toenail EXAM: RIGHT GREAT TOE COMPARISON:   07/15/2019 FINDINGS: No acute fracture or dislocation is noted. No soft tissue abnormality is seen. IMPRESSION: No acute abnormality noted. No significant change from the prior exam. Electronically Signed   By: Alcide Clever M.D.   On: 09/06/2019 19:35    Procedures Procedures (including critical care time)  Medications Ordered in ED Medications - No data to display  ED Course  I have reviewed the triage vital signs and the nursing notes.  Pertinent labs & imaging results that were available during my care of the patient were reviewed by me and considered in my medical decision making (see chart for details).    MDM Rules/Calculators/A&P                      Patient presents with right toe pain for several months.  No evidence of neurovascular compromise.  An area of ingrown toenail is possible, however, there are no signs of infection. We discussed options for management.  This included discussion of digital block with partial nail removal here in the ED versus soaks and home management with podiatry follow-up.  Patient opted for the latter. The patient was given instructions for home care as well as return precautions. Patient voices understanding of these instructions, accepts the plan, and is comfortable with discharge.   Final Clinical Impression(s) / ED Diagnoses Final diagnoses:  Pain of right great toe    Rx / DC Orders ED Discharge Orders         Ordered    lidocaine (LMX) 4 % cream  As needed     09/06/19 2003           Concepcion Living 09/07/19 1349    Geoffery Lyons, MD 09/07/19 [phone]

## 2019-09-06 NOTE — Social Work (Signed)
EDCSW met with Pt at bedside to provide resources for the [facility]. CSW counseled Pt on the importance of primary care for all aspects of healthcare.

## 2019-09-06 NOTE — Discharge Instructions (Addendum)
Wound Care  Cleaning: Clean the wound and surrounding area gently with tap water and mild soap. Rinse well and blot dry. You may shower normally. Soaking the wound in warm Epsom salt baths for no more than 15 minutes twice a day. Clean the wound daily to prevent infection. Do not use cleaners such as hydrogen peroxide or alcohol.  May apply the lidocaine cream, allowed to soak in, and then try to work the nail from the skin slowly. Pain: You may use Tylenol, naproxen, or ibuprofen for pain.  Follow up: Follow-up with the podiatrist as soon as possible on this matter.  Call to make an appointment.  Return: Return to the ED should signs of worsening infection arise, such as spreading redness, worsening puffiness/swelling, severe increase in pain, fever over 100.46F, or any other major issues.  For prescription assistance, may try using prescription discount sites or apps, such as goodrx.com

## 2019-09-06 NOTE — ED Triage Notes (Signed)
C/o R great toe pain and discoloration x 3 months with bleeding around nail.  States he has been picking at it and unsure if ingrown toenail.

## 2019-11-02 ENCOUNTER — Encounter (HOSPITAL_BASED_OUTPATIENT_CLINIC_OR_DEPARTMENT_OTHER): Payer: Self-pay | Admitting: *Deleted

## 2019-11-02 ENCOUNTER — Emergency Department (HOSPITAL_BASED_OUTPATIENT_CLINIC_OR_DEPARTMENT_OTHER)
Admission: EM | Admit: 2019-11-02 | Discharge: 2019-11-02 | Disposition: A | Discharge status: Home / Self Care | Payer: Self-pay | Attending: Emergency Medicine | Admitting: Emergency Medicine

## 2019-11-02 ENCOUNTER — Other Ambulatory Visit: Payer: Self-pay

## 2019-11-02 DIAGNOSIS — Z87891 Personal history of nicotine dependence: Secondary | ICD-10-CM | POA: Insufficient documentation

## 2019-11-02 DIAGNOSIS — Z20822 Contact with and (suspected) exposure to covid-19: Secondary | ICD-10-CM | POA: Insufficient documentation

## 2019-11-02 DIAGNOSIS — J3489 Other specified disorders of nose and nasal sinuses: Secondary | ICD-10-CM | POA: Insufficient documentation

## 2019-11-02 DIAGNOSIS — R0981 Nasal congestion: Secondary | ICD-10-CM | POA: Insufficient documentation

## 2019-11-02 DIAGNOSIS — R05 Cough: Secondary | ICD-10-CM | POA: Insufficient documentation

## 2019-11-02 MED ORDER — CETIRIZINE HCL 10 MG PO TABS: 10.0000 mg | ORAL_TABLET | Freq: Every day | ORAL | 0 refills | Status: DC | PRN

## 2019-11-02 MED ORDER — CETIRIZINE HCL 10 MG PO TABS: 10.0000 mg | ORAL_TABLET | Freq: Every day | ORAL | 0 refills | Status: AC | PRN

## 2019-11-02 MED ORDER — FLUTICASONE PROPIONATE 50 MCG/ACT NA SUSP: 1.0000 | Freq: Every day | NASAL | 0 refills | Status: DC

## 2019-11-02 NOTE — ED Triage Notes (Signed)
Nasal congestion, cough x 2 days. Denies fever

## 2019-11-02 NOTE — Discharge Instructions (Signed)
You were seen in the ER today for sneezing with nasal congestion and a cough.  We suspect your symptoms are related to allergies, however out of precaution we have tested you for COVID 19.  We have tested you for COVID 19, we will call you within the next 24 hours if results are positive, you may also view these results on MyChart.   We are instructing patient's with COVID 19 to quarantine themselves for 14 days. You may be able to discontinue self quarantine if the following conditions are met:   Persons with COVID-19 who have symptoms and were directed to care for themselves at home may discontinue home isolation under the  following conditions: - It has been at least 7 days have passed since symptoms first appeared. - AND at least 3 days (72 hours) have passed since recovery defined as resolution of fever without the use of fever-reducing medications and improvement in respiratory symptoms (e.g., cough, shortness of breath)  Please follow the below quarantine instructions.   We are sending you home with Flonase to use 1 spray per nostril daily as needed for nasal congestion and Zyrtec to take 1 tablet daily as needed for nasal congestion as well. We have prescribed you new medication(s) today. Discuss the medications prescribed today with your pharmacist as they can have adverse effects and interactions with your other medicines including over the counter and prescribed medications. Seek medical evaluation if you start to experience new or abnormal symptoms after taking one of these medicines, seek care immediately if you start to experience difficulty breathing, feeling of your throat closing, facial swelling, or rash as these could be indications of a more serious allergic reaction   Please follow up with primary care within 1 week for re-evaluation- call prior to going to the office to make them aware of your symptoms as some offices are altering their method of seeing patients with COVID 19  symptoms. Return to the ER for new or worsening symptoms including but not limited to fever, increased work of breathing, chest pain, passing out, or any other concerns.       Person Under Monitoring Name: Dennis Travis  Location: [id number] [place] [address]   Infection Prevention Recommendations for Individuals Confirmed to have, or Being Evaluated for, 2019 Novel Coronavirus (COVID-19) Infection Who Receive Care at Home  Individuals who are confirmed to have, or are being evaluated for, COVID-19 should follow the prevention steps below until a healthcare provider or local or state health department says they can return to normal activities.  Stay home except to get medical care You should restrict activities outside your home, except for getting medical care. Do not go to work, school, or public areas, and do not use public transportation or taxis.  Call ahead before visiting your doctor Before your medical appointment, call the healthcare provider and tell them that you have, or are being evaluated for, COVID-19 infection. This will help the healthcare provider's office take steps to keep other people from getting infected. Ask your healthcare provider to call the local or state health department.  Monitor your symptoms Seek prompt medical attention if your illness is worsening (e.g., difficulty breathing). Before going to your medical appointment, call the healthcare provider and tell them that you have, or are being evaluated for, COVID-19 infection. Ask your healthcare provider to call the local or state health department.  Wear a facemask You should wear a facemask that covers your nose and mouth when  you are in the same room with other people and when you visit a healthcare provider. People who live with or visit you should also wear a facemask while they are in the same room with you.  Separate yourself from other people in your home As much as possible,  you should stay in a different room from other people in your home. Also, you should use a separate bathroom, if available.  Avoid sharing household items You should not share dishes, drinking glasses, cups, eating utensils, towels, bedding, or other items with other people in your home. After using these items, you should wash them thoroughly with soap and water.  Cover your coughs and sneezes Cover your mouth and nose with a tissue when you cough or sneeze, or you can cough or sneeze into your sleeve. Throw used tissues in a lined trash can, and immediately wash your hands with soap and water for at least 20 seconds or use an alcohol-based hand rub.  Wash your [facility] your hands often and thoroughly with soap and water for at least 20 seconds. You can use an alcohol-based hand sanitizer if soap and water are not available and if your hands are not visibly dirty. Avoid touching your eyes, nose, and mouth with unwashed hands.   Prevention Steps for Caregivers and Household Members of Individuals Confirmed to have, or Being Evaluated for, COVID-19 Infection Being Cared for in the Home  If you live with, or provide care at home for, a person confirmed to have, or being evaluated for, COVID-19 infection please follow these guidelines to prevent infection:  Follow healthcare provider's instructions Make sure that you understand and can help the patient follow any healthcare provider instructions for all care.  Provide for the patient's basic needs You should help the patient with basic needs in the home and provide support for getting groceries, prescriptions, and other personal needs.  Monitor the patient's symptoms If they are getting sicker, call his or her medical provider and tell them that the patient has, or is being evaluated for, COVID-19 infection. This will help the healthcare provider's office take steps to keep other people from getting infected. Ask the healthcare  provider to call the local or state health department.  Limit the number of people who have contact with the patient If possible, have only one caregiver for the patient. Other household members should stay in another home or place of residence. If this is not possible, they should stay in another room, or be separated from the patient as much as possible. Use a separate bathroom, if available. Restrict visitors who do not have an essential need to be in the home.  Keep older adults, very young children, and other sick people away from the patient Keep older adults, very young children, and those who have compromised immune systems or chronic health conditions away from the patient. This includes people with chronic heart, lung, or kidney conditions, diabetes, and cancer.  Ensure good ventilation Make sure that shared spaces in the home have good air flow, such as from an air conditioner or an opened window, weather permitting.  Wash your hands often Wash your hands often and thoroughly with soap and water for at least 20 seconds. You can use an alcohol based hand sanitizer if soap and water are not available and if your hands are not visibly dirty. Avoid touching your eyes, nose, and mouth with unwashed hands. Use disposable paper towels to dry your hands. If not available,  use dedicated cloth towels and replace them when they become wet.  Wear a facemask and gloves Wear a disposable facemask at all times in the room and gloves when you touch or have contact with the patient's blood, body fluids, and/or secretions or excretions, such as sweat, saliva, sputum, nasal mucus, vomit, urine, or feces.  Ensure the mask fits over your nose and mouth tightly, and do not touch it during use. Throw out disposable facemasks and gloves after using them. Do not reuse. Wash your hands immediately after removing your facemask and gloves. If your personal clothing becomes contaminated, carefully remove  clothing and launder. Wash your hands after handling contaminated clothing. Place all used disposable facemasks, gloves, and other waste in a lined container before disposing them with other household waste. Remove gloves and wash your hands immediately after handling these items.  Do not share dishes, glasses, or other household items with the patient Avoid sharing household items. You should not share dishes, drinking glasses, cups, eating utensils, towels, bedding, or other items with a patient who is confirmed to have, or being evaluated for, COVID-19 infection. After the person uses these items, you should wash them thoroughly with soap and water.  Wash laundry thoroughly Immediately remove and wash clothes or bedding that have blood, body fluids, and/or secretions or excretions, such as sweat, saliva, sputum, nasal mucus, vomit, urine, or feces, on them. Wear gloves when handling laundry from the patient. Read and follow directions on labels of laundry or clothing items and detergent. In general, wash and dry with the warmest temperatures recommended on the label.  Clean all areas the individual has used often Clean all touchable surfaces, such as counters, tabletops, doorknobs, bathroom fixtures, toilets, phones, keyboards, tablets, and bedside tables, every day. Also, clean any surfaces that may have blood, body fluids, and/or secretions or excretions on them. Wear gloves when cleaning surfaces the patient has come in contact with. Use a diluted bleach solution (e.g., dilute bleach with 1 part bleach and 10 parts water) or a household disinfectant with a label that says EPA-registered for coronaviruses. To make a bleach solution at home, add 1 tablespoon of bleach to 1 quart (4 cups) of water. For a larger supply, add  cup of bleach to 1 gallon (16 cups) of water. Read labels of cleaning products and follow recommendations provided on product labels. Labels contain instructions for safe and  effective use of the cleaning product including precautions you should take when applying the product, such as wearing gloves or eye protection and making sure you have good ventilation during use of the product. Remove gloves and wash hands immediately after cleaning.  Monitor yourself for signs and symptoms of illness Caregivers and household members are considered close contacts, should monitor their health, and will be asked to limit movement outside of the home to the extent possible. Follow the monitoring steps for close contacts listed on the symptom monitoring form.   ? If you have additional questions, contact your local health department or call the epidemiologist on call at [phone] (available 24/7). ? This guidance is subject to change. For the most up-to-date guidance from [facility], please refer to their website: [email or web address].pl

## 2019-11-02 NOTE — ED Provider Notes (Signed)
[facility] EMERGENCY DEPARTMENT Provider Note   CSN: [id number] Arrival date & time: 11/02/19  2216     History Chief Complaint  Patient presents with  . Nasal Congestion    Dennis Travis is a 39 y.o. male with a history of sickle cell trait who presents to the ED with complaints of nasal congestion that began this AM. Patient reports nasal congestion, rhinorrhea, sneezing, and minimal dry cough. No alleviating/aggravating. No intervention PTA. Denies fever, chills, ear pain, sore throat, dyspnea, chest pain, or body aches.   HPI     Past Medical History:  Diagnosis Date  . Sickle cell trait (HCC)     There are no problems to display for this patient.   Past Surgical History:  Procedure Laterality Date  . HERNIA REPAIR    . TOOTH EXTRACTION    . UMBILICAL HERNIA REPAIR  05/24/2012   Procedure: HERNIA REPAIR UMBILICAL ADULT;  Surgeon: Marlane Hatcher, MD;  Location: [facility];  Service: General;  Laterality: N/A;       Family History  Problem Relation Age of Onset  . Diabetes Mother   . Stroke Father     Social History   Tobacco Use  . Smoking status: Former Smoker    Packs/day: 1.00    Years: 7.00    Pack years: 7.00    Types: Cigarettes    Quit date: 05/21/1986    Years since quitting: 33.4  . Smokeless tobacco: Never Used  Substance Use Topics  . Alcohol use: No  . Drug use: No    Home Medications Prior to Admission medications   Medication Sig Start Date End Date Taking? Authorizing Provider  lidocaine (LMX) 4 % cream Apply 1 application topically as needed. 09/06/19   Joy, Shawn C, PA-C  terbinafine (LAMISIL AT) 1 % cream Apply 1 application topically 2 (two) times daily. 07/15/19   Jacalyn Lefevre, MD    Allergies    Amoxicillin  Review of Systems   Review of Systems  Constitutional: Negative for chills and fever.  HENT: Positive for congestion and rhinorrhea. Negative for ear pain, sinus pain, trouble swallowing and voice change.    Respiratory: Positive for cough. Negative for shortness of breath.   Cardiovascular: Negative for chest pain.  Gastrointestinal: Negative for abdominal pain, diarrhea and vomiting.  Musculoskeletal: Negative for myalgias.    Physical Exam Updated Vital Signs BP 131/90 (BP Location: Left Arm)   Pulse 78   Temp 97.6 F (36.4 C) (Oral)   Resp 18   Ht 5\' 11"  (1.803 m)   Wt 93 kg   SpO2 95%   BMI 28.59 kg/m   Physical Exam Vitals and nursing note reviewed.  Constitutional:      General: He is not in acute distress.    Appearance: He is well-developed. He is not toxic-appearing.  HENT:     Head: Normocephalic and atraumatic.     Right Ear: Ear canal normal. Tympanic membrane is not perforated, erythematous, retracted or bulging.     Left Ear: Ear canal normal. Tympanic membrane is not perforated, erythematous, retracted or bulging.     Ears:     Comments: No mastoid erythema/swellng/tenderness.     Nose: Congestion present.     Right Sinus: No maxillary sinus tenderness or frontal sinus tenderness.     Left Sinus: No maxillary sinus tenderness or frontal sinus tenderness.     Comments: Mildly boggy turbinates.     Mouth/Throat:  Pharynx: Oropharynx is clear. Uvula midline. No oropharyngeal exudate or posterior oropharyngeal erythema.     Comments: Posterior oropharynx is symmetric appearing. Patient tolerating own secretions without difficulty. No trismus. No drooling. No hot potato voice. No swelling beneath the tongue, submandibular compartment is soft.  Eyes:     General:        Right eye: No discharge.        Left eye: No discharge.     Conjunctiva/sclera: Conjunctivae normal.  Cardiovascular:     Rate and Rhythm: Normal rate and regular rhythm.  Pulmonary:     Effort: Pulmonary effort is normal. No respiratory distress.     Breath sounds: Normal breath sounds. No wheezing, rhonchi or rales.  Abdominal:     General: There is no distension.     Palpations: Abdomen  is soft.     Tenderness: There is no abdominal tenderness.  Musculoskeletal:     Cervical back: Neck supple. No rigidity.  Lymphadenopathy:     Cervical: No cervical adenopathy.  Skin:    General: Skin is warm and dry.     Findings: No rash.  Neurological:     Mental Status: He is alert.  Psychiatric:        Behavior: Behavior normal.     ED Results / Procedures / Treatments   Labs (all labs ordered are listed, but only abnormal results are displayed) Labs Reviewed - No data to display  EKG None  Radiology No results found.  Procedures Procedures (including critical care time)  Medications Ordered in ED Medications - No data to display  ED Course  I have reviewed the triage vital signs and the nursing notes.  Pertinent labs & imaging results that were available during my care of the patient were reviewed by me and considered in my medical decision making (see chart for details).    Dennis Travis was evaluated in Emergency Department on 11/02/2019 for the symptoms described in the history of present illness. He/she was evaluated in the context of the global COVID-19 pandemic, which necessitated consideration that the patient might be at risk for infection with the SARS-CoV-2 virus that causes COVID-19. Institutional protocols and algorithms that pertain to the evaluation of patients at risk for COVID-19 are in a state of rapid change based on information released by regulatory bodies including the CDC and federal and state organizations. These policies and algorithms were followed during the patient's care in the ED.  MDM Rules/Calculators/A&P                      Patient presents to the ED with complaints of nasal congestion with sneezing & mild dry cough that began today. Afebrile, no sinus tenderness, sxs < 7 days, doubt acute bacterial sinusitis. Afebrile, lungs CTA- doubt CAP. Oropharynx clear. No signs of AOM. No meningismus. Suspect allergic process, considering viral  but felt to be less likely- COVID test sent. Discharge home with flonase & zyrtec. I discussed treatment plan, need for follow-up, and return precautions with the patient. Provided opportunity for questions, patient confirmed understanding and is in agreement with plan.    Final Clinical Impression(s) / ED Diagnoses Final diagnoses:  Nasal congestion    Rx / DC Orders ED Discharge Orders         Ordered    fluticasone (FLONASE) 50 MCG/ACT nasal spray  Daily,   Status:  Discontinued     11/02/19 2243    cetirizine (ZYRTEC ALLERGY) 10 MG  tablet  Daily PRN,   Status:  Discontinued     11/02/19 2243    cetirizine (ZYRTEC ALLERGY) 10 MG tablet  Daily PRN     11/02/19 2246    fluticasone (FLONASE) 50 MCG/ACT nasal spray  Daily     11/02/19 [address], PA-C 11/02/19 2247    Virgel Manifold, MD 11/03/19 1601

## 2019-11-03 LAB — SARS CORONAVIRUS 2 (TAT 6-24 HRS): SARS Coronavirus 2: NEGATIVE

## 2022-08-14 ENCOUNTER — Emergency Department (HOSPITAL_BASED_OUTPATIENT_CLINIC_OR_DEPARTMENT_OTHER): Payer: Self-pay

## 2022-08-14 ENCOUNTER — Encounter (HOSPITAL_BASED_OUTPATIENT_CLINIC_OR_DEPARTMENT_OTHER): Payer: Self-pay | Admitting: Emergency Medicine

## 2022-08-14 ENCOUNTER — Other Ambulatory Visit: Payer: Self-pay

## 2022-08-14 DIAGNOSIS — W268XXA Contact with other sharp object(s), not elsewhere classified, initial encounter: Secondary | ICD-10-CM | POA: Insufficient documentation

## 2022-08-14 DIAGNOSIS — S61210A Laceration without foreign body of right index finger without damage to nail, initial encounter: Secondary | ICD-10-CM | POA: Insufficient documentation

## 2022-08-14 NOTE — ED Triage Notes (Signed)
Patient reports cutting his right index finger on a metal can earlier today. Bleeding controlled. Last tetanus 2019.

## 2022-08-15 ENCOUNTER — Emergency Department (HOSPITAL_BASED_OUTPATIENT_CLINIC_OR_DEPARTMENT_OTHER)
Admission: EM | Admit: 2022-08-15 | Discharge: 2022-08-15 | Disposition: A | Discharge status: Home / Self Care | Payer: Self-pay | Attending: Emergency Medicine | Admitting: Emergency Medicine

## 2022-08-15 DIAGNOSIS — S61218A Laceration without foreign body of other finger without damage to nail, initial encounter: Secondary | ICD-10-CM

## 2022-08-15 MED ORDER — LIDOCAINE HCL 2 % IJ SOLN
10.0000 mL | Freq: Once | INTRAMUSCULAR | Status: AC
  Administered 2022-08-15: 200 mg
  Filled 2022-08-15: qty 20

## 2022-08-15 NOTE — ED Provider Notes (Signed)
[facility] EMERGENCY DEPARTMENT Provider Note   CSN: [id number] Arrival date & time: 08/14/22  2237     History  Chief Complaint  Patient presents with   Laceration    Dennis Travis is a 42 y.o. male.  The history is provided by the patient.  Laceration Location:  Finger Finger laceration location:  R index finger Depth:  Through dermis Quality comment:  C shaped Bleeding: controlled   Time since incident:  10 hours Laceration mechanism:  Metal edge Pain details:    Quality:  Aching   Timing:  Constant   Progression:  Unchanged Foreign body present:  No foreign bodies Relieved by:  Nothing Worsened by:  Nothing Ineffective treatments:  None tried Tetanus status:  Up to date Associated symptoms: no fever        Home Medications Prior to Admission medications   Medication Sig Start Date End Date Taking? Authorizing Provider  cetirizine (ZYRTEC ALLERGY) 10 MG tablet Take 1 tablet (10 mg total) by mouth daily as needed for allergies or rhinitis. 11/02/19   Petrucelli, Samantha R, PA-C  fluticasone (FLONASE) 50 MCG/ACT nasal spray Place 1 spray into both nostrils daily. 11/02/19   Petrucelli, Samantha R, PA-C  lidocaine (LMX) 4 % cream Apply 1 application topically as needed. 09/06/19   Joy, Shawn C, PA-C  terbinafine (LAMISIL AT) 1 % cream Apply 1 application topically 2 (two) times daily. 07/15/19   Isla Pence, MD      Allergies    Amoxicillin    Review of Systems   Review of Systems  Constitutional:  Negative for fever.  HENT:  Negative for facial swelling.   Respiratory:  Negative for wheezing and stridor.   All other systems reviewed and are negative.   Physical Exam Updated Vital Signs BP (!) 130/92 (BP Location: Left Arm)   Pulse 74   Temp 97.8 F (36.6 C) (Oral)   Resp 16   Ht 5\' 11"  (1.803 m)   Wt 93.4 kg   SpO2 99%   BMI 28.73 kg/m  Physical Exam Vitals and nursing note reviewed.  Constitutional:      General: He is not in  acute distress.    Appearance: He is well-developed. He is not diaphoretic.  HENT:     Head: Normocephalic and atraumatic.  Eyes:     Conjunctiva/sclera: Conjunctivae normal.     Pupils: Pupils are equal, round, and reactive to light.  Cardiovascular:     Rate and Rhythm: Normal rate and regular rhythm.  Pulmonary:     Effort: Pulmonary effort is normal.     Breath sounds: Normal breath sounds. No wheezing or rales.  Abdominal:     General: Bowel sounds are normal.     Palpations: Abdomen is soft.     Tenderness: There is no abdominal tenderness. There is no guarding or rebound.  Musculoskeletal:        General: Normal range of motion.       Arms:     Cervical back: Normal range of motion and neck supple.  Skin:    General: Skin is warm and dry.     Capillary Refill: Capillary refill takes less than 2 seconds.  Neurological:     Mental Status: He is alert and oriented to person, place, and time.     ED Results / Procedures / Treatments   Labs (all labs ordered are listed, but only abnormal results are displayed) Labs Reviewed - No data to display  EKG None  Radiology DG Finger Index Right  Result Date: 08/14/2022 CLINICAL DATA:  Right index finger laceration EXAM: RIGHT INDEX FINGER 2+V COMPARISON:  None Available. FINDINGS: There is no evidence of fracture or dislocation. There is no evidence of arthropathy or other focal bone abnormality. Soft tissues are unremarkable; no retained radiopaque foreign body is identified. IMPRESSION: 1. Negative. Electronically Signed   By: Fidela Salisbury M.D.   On: 08/14/2022 23:09    Procedures .Marland KitchenLaceration Repair  Date/Time: 08/15/2022 3:56 AM  Performed by: Veatrice Kells, MD Authorized by: Veatrice Kells, MD   Consent:    Consent obtained:  Verbal   Consent given by:  Patient   Risks discussed:  Infection, need for additional repair, nerve damage, pain, poor cosmetic result and poor wound healing   Alternatives discussed:  No  treatment Universal protocol:    Patient identity confirmed:  Arm band Anesthesia:    Anesthesia method:  Local infiltration   Local anesthetic:  Lidocaine 1% w/o epi Laceration details:    Location:  Finger   Finger location:  R index finger   Length (cm):  1 Pre-procedure details:    Preparation:  Patient was prepped and draped in usual sterile fashion Exploration:    Hemostasis achieved with:  Direct pressure   Imaging outcome: foreign body not noted     Wound exploration: wound explored through full range of motion     Wound extent: fascia not violated     Contaminated: no   Treatment:    Area cleansed with:  Povidone-iodine, chlorhexidine and saline   Debridement:  None Skin repair:    Repair method:  Sutures   Suture size:  5-0   Wound skin closure material used: vicryl.   Number of sutures:  3 Approximation:    Approximation:  Close Repair type:    Repair type:  Simple Post-procedure details:    Dressing:  Sterile dressing   Procedure completion:  Tolerated well, no immediate complications     Medications Ordered in ED Medications  lidocaine (XYLOCAINE) 2 % (with pres) injection 200 mg (has no administration in time range)    ED Course/ Medical Decision Making/ A&P                           Medical Decision Making Laceration to the right index finger   Amount and/or Complexity of Data Reviewed External Data Reviewed: notes.    Details: Previous  Radiology: ordered.  Risk Prescription drug management. Risk Details: No submersion in any water for 2 weeks. Wound care instructions given.  Strict return precautions.      Final Clinical Impression(s) / ED Diagnoses Final diagnoses:  Laceration of index finger without foreign body without damage to nail, unspecified laterality, initial encounter   Return for intractable cough, coughing up blood, fevers > 100.4 unrelieved by medication, shortness of breath, intractable vomiting, chest pain, shortness of  breath, weakness, numbness, changes in speech, facial asymmetry, abdominal pain, passing out, Inability to tolerate liquids or food, cough, altered mental status or any concerns. No signs of systemic illness or infection. The patient is nontoxic-appearing on exam and vital signs are within normal limits.  I have reviewed the triage vital signs and the nursing notes. Pertinent labs & imaging results that were available during my care of the patient were reviewed by me and considered in my medical decision making (see chart for details). After history, exam, and medical workup I feel  the patient has been appropriately medically screened and is safe for discharge home. Pertinent diagnoses were discussed with the patient. Patient was given return precautions Rx / DC Orders ED Discharge Orders     None         Koden Hunzeker, MD 08/15/22 5462

## 2023-05-11 ENCOUNTER — Encounter (HOSPITAL_COMMUNITY): Payer: Self-pay

## 2023-05-11 ENCOUNTER — Emergency Department (HOSPITAL_COMMUNITY)
Admission: EM | Admit: 2023-05-11 | Discharge: 2023-05-12 | Disposition: A | Discharge status: Home / Self Care | Payer: Self-pay | Attending: Emergency Medicine | Admitting: Emergency Medicine

## 2023-05-11 ENCOUNTER — Other Ambulatory Visit: Payer: Self-pay

## 2023-05-11 DIAGNOSIS — Z20822 Contact with and (suspected) exposure to covid-19: Secondary | ICD-10-CM | POA: Insufficient documentation

## 2023-05-11 DIAGNOSIS — J069 Acute upper respiratory infection, unspecified: Secondary | ICD-10-CM | POA: Insufficient documentation

## 2023-05-11 NOTE — ED Triage Notes (Signed)
Productive cough, sore throat, fever, and dizziness started this am. Not feeling well. Took alkaseltzer for it.

## 2023-05-12 ENCOUNTER — Emergency Department (HOSPITAL_COMMUNITY): Payer: Self-pay

## 2023-05-12 LAB — SARS CORONAVIRUS 2 BY RT PCR: SARS Coronavirus 2 by RT PCR: NEGATIVE

## 2023-05-12 LAB — GROUP A STREP BY PCR: Group A Strep by PCR: NOT DETECTED

## 2023-05-12 MED ORDER — IPRATROPIUM BROMIDE 0.03 % NA SOLN: 2.0000 | Freq: Two times a day (BID) | NASAL | 0 refills | Status: AC

## 2023-05-12 MED ORDER — BENZONATATE 100 MG PO CAPS: 100.0000 mg | ORAL_CAPSULE | Freq: Two times a day (BID) | ORAL | 0 refills | Status: DC | PRN

## 2023-05-12 NOTE — ED Provider Notes (Signed)
[facility] Emergency Department Provider Note MRN:  [id number]  Arrival date & time: 05/12/23     Chief Complaint   Cough   History of Present Illness   Dennis Travis is a 42 y.o. year-old male presents to the ED with chief complaint of cough and runny nose that started this morning.  States that others at his job have been out with the same.  Denies fever, but has felt cold.  Reports minor sore throat.  Denies hx of asthma or COPD.   History provided by patient.   Review of Systems  Pertinent positive and negative review of systems noted in HPI.    Physical Exam   Vitals:   05/11/23 2355  BP: (!) 135/94  Pulse: 85  Resp: 18  Temp: 98 F (36.7 C)  SpO2: 98%    CONSTITUTIONAL:  well-appearing, NAD NEURO:  Alert and oriented x 3, CN 3-12 grossly intact EYES:  eyes equal and reactive ENT/NECK:  Supple, no stridor, oropharynx is clear CARDIO:  normal rate, regular rhythm, appears well-perfused  PULM:  No respiratory distress, CTAB  GI/GU:  non-distended,  MSK/SPINE:  No gross deformities, no edema, moves all extremities  SKIN:  no rash, atraumatic   *Additional and/or pertinent findings included in MDM below  Diagnostic and Interventional Summary    EKG Interpretation Date/Time:    Ventricular Rate:    PR Interval:    QRS Duration:    QT Interval:    QTC Calculation:   R Axis:      Text Interpretation:         Labs Reviewed  SARS CORONAVIRUS 2 BY RT PCR  GROUP A STREP BY PCR    DG Chest 2 View  Final Result      Medications - No data to display   Procedures  /  Critical Care Procedures  ED Course and Medical Decision Making  I have reviewed the triage vital signs, the nursing notes, and pertinent available records from the EMR.  Social Determinants Affecting Complexity of Care: Patient has no clinically significant social determinants affecting this chief complaint..   ED Course:    Medical Decision Making Pt CXR  negative for acute infiltrate. Patients symptoms are consistent with URI, likely viral etiology. Discussed that antibiotics are not indicated for viral infections. Pt will be discharged with symptomatic treatment.  Verbalizes understanding and is agreeable with plan. Pt is hemodynamically stable & in NAD prior to dc.   Amount and/or Complexity of Data Reviewed Radiology: ordered.         Consultants: No consultations were needed in caring for this patient.   Treatment and Plan: Emergency department workup does not suggest an emergent condition requiring admission or immediate intervention beyond  what has been performed at this time. The patient is safe for discharge and has  been instructed to return immediately for worsening symptoms, change in  symptoms or any other concerns    Final Clinical Impressions(s) / ED Diagnoses     ICD-10-CM   1. Viral URI with cough  J06.9       ED Discharge Orders          Ordered    benzonatate (TESSALON) 100 MG capsule  2 times daily PRN        05/12/23 0416    ipratropium (ATROVENT) 0.03 % nasal spray  Every 12 hours        05/12/23 0416  Discharge Instructions Discussed with and Provided to Patient:   Discharge Instructions   None      Roxy Horseman, PA-C 05/12/23 0416    Mesner, Barbara Cower, MD 05/13/23 [phone]

## 2024-02-01 ENCOUNTER — Encounter (HOSPITAL_BASED_OUTPATIENT_CLINIC_OR_DEPARTMENT_OTHER): Payer: Self-pay | Admitting: Emergency Medicine

## 2024-02-01 ENCOUNTER — Emergency Department (HOSPITAL_BASED_OUTPATIENT_CLINIC_OR_DEPARTMENT_OTHER)
Admission: EM | Admit: 2024-02-01 | Discharge: 2024-02-01 | Disposition: A | Discharge status: Home / Self Care | Payer: Self-pay | Attending: Emergency Medicine | Admitting: Emergency Medicine

## 2024-02-01 ENCOUNTER — Other Ambulatory Visit: Payer: Self-pay

## 2024-02-01 DIAGNOSIS — J069 Acute upper respiratory infection, unspecified: Secondary | ICD-10-CM | POA: Insufficient documentation

## 2024-02-01 LAB — GROUP A STREP BY PCR: Group A Strep by PCR: NOT DETECTED

## 2024-02-01 LAB — RESP PANEL BY RT-PCR (RSV, FLU A&B, COVID)  RVPGX2
Influenza A by PCR: NEGATIVE
Influenza B by PCR: NEGATIVE
Resp Syncytial Virus by PCR: NEGATIVE
SARS Coronavirus 2 by RT PCR: NEGATIVE

## 2024-02-01 MED ORDER — IBUPROFEN 800 MG PO TABS: 800.0000 mg | ORAL_TABLET | Freq: Three times a day (TID) | ORAL | 0 refills | Status: AC | PRN

## 2024-02-01 MED ORDER — BENZONATATE 100 MG PO CAPS: 100.0000 mg | ORAL_CAPSULE | Freq: Three times a day (TID) | ORAL | 0 refills | Status: AC

## 2024-02-01 MED ORDER — FLUTICASONE PROPIONATE 50 MCG/ACT NA SUSP: 2.0000 | Freq: Every day | NASAL | 0 refills | Status: AC

## 2024-02-01 NOTE — ED Provider Notes (Signed)
 Emergency Department Provider Note   I have reviewed the triage vital signs and the nursing notes.   HISTORY  Chief Complaint URI   HPI Dennis Travis is a 43 y.o. male past reviewed below presents emergency department with cough and congestion over the past 2 to 3 days.  He has some associated sore throat.  He feels pain in his chest but only with deep/heavy coughing.  He has had some poor p.o. intake.  He has been around others with similar respiratory symptoms.  No shortness of breath.   Past Medical History:  Diagnosis Date   Sickle cell trait (HCC)     Review of Systems  Constitutional: No fever/chills ENT: Positive sore throat and congestion.  Cardiovascular: CP with coughing only.  Respiratory: Denies shortness of breath. Positive cough.  Gastrointestinal: No abdominal pain.  No nausea, no vomiting.   Skin: Negative for rash. Neurological: Positive HA.   ____________________________________________   PHYSICAL EXAM:  VITAL SIGNS: ED Triage Vitals  Encounter Vitals Group     BP 02/01/24 2055 (!) 141/97     Pulse Rate 02/01/24 2055 91     Resp 02/01/24 2055 18     Temp 02/01/24 2055 98.3 F (36.8 C)     Temp src --      SpO2 02/01/24 2055 98 %     Weight 02/01/24 2055 210 lb (95.3 kg)     Height 02/01/24 2055 5' 11 (1.803 m)   Constitutional: Alert and oriented. Well appearing and in no acute distress. Eyes: Conjunctivae are normal.  Head: Atraumatic. Nose: Positive congestion/rhinnorhea. Mouth/Throat: Mucous membranes are moist.  Oropharynx with mild erythema.  Neck: No stridor.   Cardiovascular: Normal rate, regular rhythm. Good peripheral circulation. Grossly normal heart sounds.   Respiratory: Normal respiratory effort.  No retractions. Lungs CTAB. Gastrointestinal: No distention.  Musculoskeletal: No gross deformities of extremities. Neurologic:  Normal speech and language.  Skin:  Skin is warm, dry and intact. No rash  noted.   ____________________________________________   LABS (all labs ordered are listed, but only abnormal results are displayed)  Labs Reviewed  GROUP A STREP BY PCR  RESP PANEL BY RT-PCR (RSV, FLU A&B, COVID)  [id number]   ____________________________________________   PROCEDURES  Procedure(s) performed:   Procedures  None  ____________________________________________   INITIAL IMPRESSION / ASSESSMENT AND PLAN / ED COURSE  Pertinent labs & imaging results that were available during my care of the patient were reviewed by me and considered in my medical decision making (see chart for details).   This patient is Presenting for Evaluation of URI, which does require a range of treatment options, and is a complaint that involves a high risk of morbidity and mortality.  The Differential Diagnoses include COVID, Flu, CAP, bronchitis, etc.  Clinical Laboratory Tests Ordered, included strep negative. COVID/FLU/RSV negative.   Radiologic Tests: Considered CXR but lungs are CTABL. No Hypoxemia or increased WOB. Defer imaging for now.   Medical Decision Making: Summary:  Patient with upper respiratory infection symptoms.  Viral panel and strep negative. Plan for supportive care and PCP follow up.   Patient's presentation is most consistent with acute, uncomplicated illness.   Disposition: discharge  ____________________________________________  FINAL CLINICAL IMPRESSION(S) / ED DIAGNOSES  Final diagnoses:  Viral upper respiratory tract infection     NEW OUTPATIENT MEDICATIONS STARTED DURING THIS VISIT:  New Prescriptions   BENZONATATE  (TESSALON ) 100 MG CAPSULE    Take 1 capsule (100 mg total) by mouth every  8 (eight) hours.   FLUTICASONE  (FLONASE ) 50 MCG/ACT NASAL SPRAY    Place 2 sprays into both nostrils daily for 7 days.   IBUPROFEN  (ADVIL ) 800 MG TABLET    Take 1 tablet (800 mg total) by mouth every 8 (eight) hours as needed for mild pain (pain score 1-3) or moderate  pain (pain score 4-6).    Note:  This document was prepared using Dragon voice recognition software and may include unintentional dictation errors.  Fonda Law, MD, [facility] Emergency Medicine    Eusebia Grulke, Fonda MATSU, MD 02/01/24 2156

## 2024-02-01 NOTE — ED Triage Notes (Signed)
 Pt POV reports known sick contact- c/o cough, chest pain when coughing, sore throat, dizziness at work, generalized weakness.  Poor po intake today. Denies urinary sx.   Reports taking nyquil last night.

## 2024-02-01 NOTE — Discharge Instructions (Signed)
 We believe your persistent cough is a result of a viral syndrome for which your symptoms have still not quite resolved.  Sometimes it takes many weeks to completely go away, especially if you smoke or have chronic lung problems.  Please take any medications prescribed and follow up as recommended with your regular doctor.  If you develop any new or worsening symptoms, including but not limited to fever, persistent vomiting, worsening shortness of breath, or other symptoms that concern you, please return to the Emergency Department immediately.

## 2024-02-19 ENCOUNTER — Encounter: Payer: Self-pay | Admitting: Advanced Practice Midwife
